# Patient Record
Sex: Female | Born: 1990 | Race: Black or African American | Hispanic: No | Marital: Single | Smoking: Never smoker
Health system: Southern US, Community
[De-identification: ages and names within clinical notes are randomized; demographics above are authoritative.]

## PROBLEM LIST (undated history)

## (undated) DIAGNOSIS — N390 Urinary tract infection, site not specified: Secondary | ICD-10-CM

## (undated) DIAGNOSIS — B009 Herpesviral infection, unspecified: Secondary | ICD-10-CM

## (undated) DIAGNOSIS — A749 Chlamydial infection, unspecified: Secondary | ICD-10-CM

## (undated) DIAGNOSIS — T7840XA Allergy, unspecified, initial encounter: Secondary | ICD-10-CM

## (undated) HISTORY — DX: Allergy, unspecified, initial encounter: T78.40XA

## (undated) HISTORY — DX: Herpesviral infection, unspecified: B00.9

## (undated) HISTORY — PX: NO PAST SURGERIES: SHX2092

---
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
## 2012-02-08 ENCOUNTER — Emergency Department (HOSPITAL_COMMUNITY)
Admission: EM | Admit: 2012-02-08 | Discharge: 2012-02-08 | Disposition: A | Discharge status: Home / Self Care | Payer: Self-pay | Source: Home / Self Care | Attending: Emergency Medicine | Admitting: Emergency Medicine

## 2012-02-08 ENCOUNTER — Encounter (HOSPITAL_COMMUNITY): Payer: Self-pay | Admitting: Emergency Medicine

## 2012-02-08 ENCOUNTER — Emergency Department (INDEPENDENT_AMBULATORY_CARE_PROVIDER_SITE_OTHER)
Admission: EM | Admit: 2012-02-08 | Discharge: 2012-02-08 | Disposition: A | Discharge status: Home Health | Payer: Self-pay | Source: Home / Self Care | Attending: Family Medicine | Admitting: Family Medicine

## 2012-02-08 DIAGNOSIS — A6000 Herpesviral infection of urogenital system, unspecified: Secondary | ICD-10-CM

## 2012-02-08 MED ORDER — VALACYCLOVIR HCL 1 G PO TABS: 1000.0000 mg | ORAL_TABLET | Freq: Two times a day (BID) | ORAL | Status: DC

## 2012-02-08 NOTE — Discharge Instructions (Signed)
Take all of medicine as prescribed , see gyn if further problems or outbreaks. We will call with test results.

## 2012-02-08 NOTE — ED Provider Notes (Signed)
History     CSN: [id number]  Arrival date & time 02/08/12  1150   First MD Initiated Contact with Patient 02/08/12 1506      Chief Complaint  Patient presents with  . Vaginal Pain  . Genital Warts    (Consider location/radiation/quality/duration/timing/severity/associated sxs/prior treatment) Patient is a 21 y.o. female presenting with vaginal discharge. The history is provided by the patient.  Vaginal Discharge This is a new problem. The current episode started more than 2 days ago. The problem occurs constantly. The problem has been gradually worsening. Associated symptoms comments: Inguinal nodes tender.. Exacerbated by: urination pain in vulvar area.    History reviewed. No pertinent past medical history.  History reviewed. No pertinent past surgical history.  No family history on file.  History  Substance Use Topics  . Smoking status: Never Smoker   . Smokeless tobacco: Not on file  . Alcohol Use: No    OB History    Grav Para Term Preterm Abortions TAB SAB Ect Mult Living                  Review of Systems  Constitutional: Negative.   Gastrointestinal: Negative.   Genitourinary: Positive for vaginal discharge and vaginal pain.    Allergies  Review of patient's allergies indicates no known allergies.  Home Medications   Current Outpatient Rx  Name Route Sig Dispense Refill  . VALACYCLOVIR HCL 1 G PO TABS Oral Take 1 tablet (1,000 mg total) by mouth 2 (two) times daily. 20 tablet 0    BP 119/83  Pulse 94  Temp(Src) 98.6 F (37 C) (Oral)  Resp 16  SpO2 100%  LMP 02/07/2012  Physical Exam  Nursing note and vitals reviewed. Constitutional: She appears well-developed and well-nourished.  Abdominal: Soft. Bowel sounds are normal.  Genitourinary:     Skin: Skin is warm and dry.    ED Course  Procedures (including critical care time)  Labs Reviewed - No data to display No results found.   1. Primary genital herpes simplex infection        MDM          Barkley Bruns, MD 02/08/12 [phone]

## 2012-02-08 NOTE — ED Notes (Signed)
PT HERE FOR POSS GENITAL WARTS THAT APPEARED 02/02/12 POST UNPROTECTED SEX WITH BOYFRIEND.C/O CONSTANT,BURNING WITH URINATION,RED BLISTERS SEEN.PT TRIED TO TREAT WITH AZO/MONISTAT FOR YEAST INFECTION BUT PT STATES BLISTERS WORSENED.VAG ODOR AND MIN D/C ALSO REPORTED.PT IS CURRENTLY ON MENSTRUAL

## 2012-02-12 LAB — HERPES SIMPLEX VIRUS CULTURE

## 2012-02-13 ENCOUNTER — Telehealth (HOSPITAL_COMMUNITY): Payer: Self-pay | Admitting: *Deleted

## 2012-02-13 NOTE — ED Notes (Signed)
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 Pt. adequately treated with Valtrex. I called pt. on cell # and it was busy. I called on home #.   Pt. verified x 2 and given results. Pt. instructed to notify her partner. You can pass the virus even when you don't have an outbreak, so always practice safe sex. Get treated for each outbreak with Acyclovir or Valtrex. You may want to get an OB-GYN doctor who can call in a prescription for you when you have an outbreak. Pt. voiced understanding. Pt. asked if she needed to notify any future partners and I said it would be the best thing to do because if the condom ever breaks and your partner gets it, he won't be happy the you did not tell him. Vassie Moselle 02/13/2012

## 2012-03-29 ENCOUNTER — Encounter: Payer: Self-pay | Admitting: Obstetrics and Gynecology

## 2012-04-09 ENCOUNTER — Encounter: Payer: Self-pay | Admitting: Obstetrics and Gynecology

## 2012-04-12 ENCOUNTER — Encounter: Payer: Self-pay | Admitting: Obstetrics and Gynecology

## 2012-04-16 ENCOUNTER — Ambulatory Visit (INDEPENDENT_AMBULATORY_CARE_PROVIDER_SITE_OTHER): Payer: Self-pay | Admitting: Obstetrics and Gynecology

## 2012-04-16 ENCOUNTER — Encounter: Payer: Self-pay | Admitting: Obstetrics and Gynecology

## 2012-04-16 VITALS — BP 116/68 | Temp 99.2°F | Ht 65.0 in | Wt 159.0 lb

## 2012-04-16 DIAGNOSIS — A64 Unspecified sexually transmitted disease: Secondary | ICD-10-CM

## 2012-04-16 DIAGNOSIS — Z113 Encounter for screening for infections with a predominantly sexual mode of transmission: Secondary | ICD-10-CM

## 2012-04-16 MED ORDER — VALACYCLOVIR HCL 1 G PO TABS: 500.0000 mg | ORAL_TABLET | Freq: Two times a day (BID) | ORAL | Status: DC

## 2012-04-16 NOTE — Progress Notes (Signed)
20 YO requesting STD testing.  Denies vaginitis symptoms but has questions about herpes.  Also requests refills on her Valtrex.  O:  Pelvic:  EGBUS-erythematous, mildly tender with small  lesions, vagina-white discharge, cervix-without lesions or tenderness, uterus-normal size & shape, adnexae-no masses or tenderness  A: STD Testing      HSV-2  P: Refilled Valtrex 500 mg #30 bid x 3-5 days prn          GC/CT/RPR -pending      Reviewed HSV-2 prevention, transmission, treatment,     natural history and considerations; cdc website info     given

## 2012-04-18 ENCOUNTER — Telehealth: Payer: Self-pay

## 2012-04-18 NOTE — Progress Notes (Signed)
TYVONA WILL YOU  DO THIS FOR ME. THANKS  LM

## 2012-04-19 NOTE — Telephone Encounter (Signed)
Informed pt + chlamydia results. Zithromax 500 mg #2 2 po stat called to [facility] [address]. Pt also informed we can write a rx for her partner. She stated she doesn't want her partner treated. Also made a f/u appointment 05/09/12 @ 4pm. Pt voices understanding & agrees.

## 2012-04-19 NOTE — Telephone Encounter (Signed)
Emily Horne took care of pt

## 2012-05-09 ENCOUNTER — Encounter: Payer: Self-pay | Admitting: Obstetrics and Gynecology

## 2013-03-13 ENCOUNTER — Inpatient Hospital Stay (HOSPITAL_COMMUNITY)
Admission: AD | Admit: 2013-03-13 | Discharge: 2013-03-13 | Disposition: A | Discharge status: Home / Self Care | Payer: Medicaid Other | Source: Ambulatory Visit | Attending: Obstetrics & Gynecology | Admitting: Obstetrics & Gynecology

## 2013-03-13 ENCOUNTER — Encounter (HOSPITAL_COMMUNITY): Payer: Self-pay

## 2013-03-13 DIAGNOSIS — O200 Threatened abortion: Secondary | ICD-10-CM | POA: Insufficient documentation

## 2013-03-13 DIAGNOSIS — Z8619 Personal history of other infectious and parasitic diseases: Secondary | ICD-10-CM | POA: Diagnosis not present

## 2013-03-13 DIAGNOSIS — B009 Herpesviral infection, unspecified: Secondary | ICD-10-CM | POA: Diagnosis not present

## 2013-03-13 LAB — CBC
HCT: 37.5 % (ref 36.0–46.0)
Hemoglobin: 12.4 g/dL (ref 12.0–15.0)
RDW: 13.7 % (ref 11.5–15.5)
WBC: 11.9 10*3/uL — ABNORMAL HIGH (ref 4.0–10.5)

## 2013-03-13 LAB — HCG, QUANTITATIVE, PREGNANCY: hCG, Beta Chain, Quant, S: 14 m[IU]/mL — ABNORMAL HIGH (ref <5)

## 2013-03-13 NOTE — MAU Provider Note (Signed)
Attestation of Attending Supervision of Advanced Practitioner (CNM/NP): Evaluation and management procedures were performed by the Advanced Practitioner under my supervision and collaboration.  I have reviewed the Advanced Practitioner's note and chart, and I agree with the management and plan.  HARRAWAY-SMITH, Cristela Stalder 7:42 PM

## 2013-03-13 NOTE — MAU Note (Signed)
Still bleeding at present, no abnormal vaginal discharge

## 2013-03-13 NOTE — MAU Note (Signed)
Patient states she had a positive home pregnancy test about 2 weeks ago. States that she started bleeding on 3-27 and passed something that was round with bloody strings hanging from it. States she continues to bleed like a period. No pain.

## 2013-03-13 NOTE — MAU Provider Note (Signed)
History     CSN: [id number]  Arrival date and time: 03/13/13 4098   None     Chief Complaint  Patient presents with  . Possible Pregnancy   HPI This is a 22 y.o. female at 6.[redacted]weeks gestation (by LMP) who presents with c/o vaginal bleeding with passage of what she think was a gestational sac on 3/27.  Bleeding less now. No cramps. Formerly a patient of [facility] but does not have insurance now.  Works Office manager at [employer].  Does not want to have a physical exam. "I just want to find out if I am still pregnant".    RN Note: Patient states she had a positive home pregnancy test about 2 weeks ago. States that she started bleeding on 3-27 and passed something that was round with bloody strings hanging from it. States she continues to bleed like a period. No pain.        OB History   Grav Para Term Preterm Abortions TAB SAB Ect Mult Living   0 0 0             Past Medical History  Diagnosis Date  . Herpes simplex without mention of complication     No past surgical history on file.  Family History  Problem Relation Age of Onset  . Diabetes Father   . Asthma Father   . Migraines Mother     History  Substance Use Topics  . Smoking status: Never Smoker   . Smokeless tobacco: Never Used  . Alcohol Use: Yes     Comment: socialy    Allergies: No Known Allergies  Prescriptions prior to admission  Medication Sig Dispense Refill  . valACYclovir (VALTREX) 1000 MG tablet Take 0.5 tablets (500 mg total) by mouth 2 (two) times daily.  30 tablet  11    Review of Systems  Constitutional: Negative for fever, chills and malaise/fatigue.  Gastrointestinal: Negative for nausea, vomiting, abdominal pain, diarrhea and constipation.  Genitourinary:       Light vaginal bleeding  Neurological: Negative for headaches.   Physical Exam   Blood pressure 122/94, pulse 100, temperature 98.3 F (36.8 C), temperature source Oral, resp. rate 16, height 5\' 5"  (1.651 m), weight 167 lb 6.4 oz (75.932  kg), last menstrual period 01/24/2013, SpO2 100.00%.  Physical Exam  Constitutional: She is oriented to person, place, and time. She appears well-developed and well-nourished. No distress.  HENT:  Head: Normocephalic.  Cardiovascular: Normal rate.   Respiratory: Effort normal.  GI:  Declines physical examination  Neurological: She is alert and oriented to person, place, and time.  Skin: Skin is warm and dry.  Psychiatric: She has a normal mood and affect.    MAU Course  Procedures  MDM Quant, CBC, and ABO/Rh drawn. Discussed possibility that she still could be pregnant, even with bleeding. Pt insists what she saw pass was a gestational sac, as it was white, globular.  States "you are freaking me out, saying I could still be pregnant".  Explained that even after miscarriage, sometimes pregnancy hormone persists and we need to establish that it is going down or already 0. Pt agreeable to that, but refuses physical examination.  Results for orders placed during the hospital encounter of 03/13/13 (from the past 24 hour(s))  HCG, QUANTITATIVE, PREGNANCY     Status: Abnormal   Collection Time    03/13/13  8:15 AM      Result Value Range   hCG, Beta Chain, Quant, S 14 (*) <5  mIU/mL  CBC     Status: Abnormal   Collection Time    03/13/13  8:15 AM      Result Value Range   WBC 11.9 (*) 4.0 - 10.5 K/uL   RBC 4.28  3.87 - 5.11 MIL/uL   Hemoglobin 12.4  12.0 - 15.0 g/dL   HCT 95.6  21.3 - 08.6 %   MCV 87.6  78.0 - 100.0 fL   MCH 29.0  26.0 - 34.0 pg   MCHC 33.1  30.0 - 36.0 g/dL   RDW 57.8  46.9 - 62.9 %   Platelets 386  150 - 400 K/uL  ABO/RH     Status: None   Collection Time    03/13/13  8:15 AM      Result Value Range   ABO/RH(D) O POS      Assessment and Plan  A:  Pregnancy at 6.6 weeks by LMP      Probable SAB  P:  Reviewed findings      Pt informed we cannot assume this is an SAB without rechecking Quant HCG      Pt declines Comfort kit. States she is happy about SAB,  was going to abort anyway.      Offered to schedule followup visit in clinic, but she wants to save money and go to [place] at [facility] 03/13/2013, 8:22 AM

## 2013-03-16 ENCOUNTER — Encounter (HOSPITAL_COMMUNITY): Payer: Self-pay | Admitting: Emergency Medicine

## 2013-03-16 ENCOUNTER — Emergency Department (INDEPENDENT_AMBULATORY_CARE_PROVIDER_SITE_OTHER)
Admission: EM | Admit: 2013-03-16 | Discharge: 2013-03-16 | Disposition: A | Discharge status: Home / Self Care | Payer: Medicaid Other | Source: Home / Self Care

## 2013-03-16 DIAGNOSIS — J020 Streptococcal pharyngitis: Secondary | ICD-10-CM

## 2013-03-16 LAB — POCT RAPID STREP A: Streptococcus, Group A Screen (Direct): POSITIVE — AB

## 2013-03-16 MED ORDER — AMOXICILLIN 500 MG PO CAPS: 1000.0000 mg | ORAL_CAPSULE | Freq: Two times a day (BID) | ORAL | Status: DC

## 2013-03-16 NOTE — ED Notes (Signed)
Sore throat that started yesterday.  100.9 temp this am.  Reports generalized body aches

## 2013-03-16 NOTE — ED Provider Notes (Signed)
History     CSN: [id number]  Arrival date & time 03/16/13  1100   First MD Initiated Contact with Patient 03/16/13 1117      Chief Complaint  Patient presents with  . Sore Throat    (Consider location/radiation/quality/duration/timing/severity/associated sxs/prior treatment) HPI Comments: 22 year old female accompanied by significant other complaining of sore throat for less than 24 hours. Is associated with a painful swallowing, fever and general bodyaches. She states she vomited once. No diarrhea, abdominal pain, earache, upper respiratory congestion abdominal pain.   Past Medical History  Diagnosis Date  . Herpes simplex without mention of complication     Past Surgical History  Procedure Laterality Date  . No past surgeries      Family History  Problem Relation Age of Onset  . Diabetes Father   . Asthma Father   . Migraines Mother     History  Substance Use Topics  . Smoking status: Never Smoker   . Smokeless tobacco: Never Used  . Alcohol Use: Yes     Comment: socialy    OB History   Grav Para Term Preterm Abortions TAB SAB Ect Mult Living   0 0 0             Review of Systems  Constitutional: Positive for fever and activity change. Negative for chills, appetite change and fatigue.  HENT: Positive for sore throat and trouble swallowing. Negative for congestion, facial swelling, rhinorrhea, neck pain, neck stiffness and postnasal drip.   Eyes: Negative.   Respiratory: Negative.   Cardiovascular: Negative.   Gastrointestinal: Negative.   Genitourinary: Negative.   Musculoskeletal: Negative.   Skin: Negative.  Negative for pallor and rash.  Neurological: Negative.     Allergies  Review of patient's allergies indicates no known allergies.  Home Medications   Current Outpatient Rx  Name  Route  Sig  Dispense  Refill  . ValACYclovir HCl (VALTREX PO)   Oral   Take by mouth.         Marland Kitchen amoxicillin (AMOXIL) 500 MG capsule   Oral   Take 2 capsules  (1,000 mg total) by mouth 2 (two) times daily.   28 capsule   0   . HYDROcodone-acetaminophen (VICODIN) 5-500 MG per tablet   Oral   Take 1 tablet by mouth daily as needed for pain.         Marland Kitchen OVER THE COUNTER MEDICATION   Oral   Take 2 tablets by mouth daily as needed (pt takes every morning as needed for sleep.).           BP 111/74  Pulse 111  Temp(Src) 99.4 F (37.4 C) (Oral)  Resp 16  SpO2 98%  LMP 03/09/2013  Physical Exam  Nursing note and vitals reviewed. Constitutional: She is oriented to person, place, and time. She appears well-developed and well-nourished. No distress.  HENT:  Bilateral TMs are normal Oropharynx with deep erythema, mildly enlarged palatine tonsils coated with white exudates.  Neck: Normal range of motion. Neck supple.  No posterior cervical nodes palpated  Cardiovascular: Normal rate and regular rhythm.   Pulmonary/Chest: Effort normal and breath sounds normal. No respiratory distress.  Musculoskeletal: Normal range of motion. She exhibits no edema.  Lymphadenopathy:    She has cervical adenopathy.  Neurological: She is alert and oriented to person, place, and time.  Skin: Skin is warm and dry. No rash noted.  Psychiatric: She has a normal mood and affect.    ED Course  Procedures (  including critical care time)  Labs Reviewed - No data to display No results found.      1. Streptococcal pharyngitis       MDM  Strep swab is positive. Amoxicillin 500 mg 2 capsules twice a day for 7 days Drink plenty of fluids and stay well-hydrated Cepacol lozenges for sore throat discomfort Chloraseptic Spray. Tylenol 650 mg every 4-6 hours when necessary sore throat pain. Recheck with your primary care doctor if not improving in 48 hours. If worsening symptoms or problems may return.        Hayden Rasmussen, NP 03/16/13 1144

## 2013-03-16 NOTE — ED Provider Notes (Signed)
Medical screening examination/treatment/procedure(s) were performed by non-physician practitioner and as supervising physician I was immediately available for consultation/collaboration.  Leslee Home, M.D.  Reuben Likes, MD 03/16/13 1536

## 2013-05-16 ENCOUNTER — Encounter: Payer: Self-pay | Admitting: Internal Medicine

## 2013-05-16 ENCOUNTER — Ambulatory Visit (INDEPENDENT_AMBULATORY_CARE_PROVIDER_SITE_OTHER): Payer: BC Managed Care – PPO | Admitting: Internal Medicine

## 2013-05-16 VITALS — BP 122/98 | Temp 98.0°F | Ht 66.0 in | Wt 161.4 lb

## 2013-05-16 DIAGNOSIS — J309 Allergic rhinitis, unspecified: Secondary | ICD-10-CM

## 2013-05-16 DIAGNOSIS — Z0000 Encounter for general adult medical examination without abnormal findings: Secondary | ICD-10-CM

## 2013-05-16 DIAGNOSIS — B009 Herpesviral infection, unspecified: Secondary | ICD-10-CM

## 2013-05-16 MED ORDER — VITAMIN D 1000 UNITS PO TABS: 1000.0000 [IU] | ORAL_TABLET | Freq: Every day | ORAL | Status: DC

## 2013-05-16 MED ORDER — FLUTICASONE PROPIONATE 50 MCG/ACT NA SUSP: 2.0000 | Freq: Every day | NASAL | Status: DC

## 2013-05-16 MED ORDER — VALACYCLOVIR HCL 500 MG PO TABS: 500.0000 mg | ORAL_TABLET | Freq: Two times a day (BID) | ORAL | Status: DC

## 2013-05-16 MED ORDER — LORATADINE 10 MG PO TABS: 10.0000 mg | ORAL_TABLET | Freq: Every day | ORAL | Status: DC

## 2013-05-16 NOTE — Progress Notes (Signed)
  Subjective:    Patient ID: Emily Horne, female    DOB: 1991/11/25, 22 y.o.   MRN: [id number]  HPI The patient is here for a wellness exam. The patient has been doing well overall without major physical or psychological issues going on lately.   Review of Systems  Constitutional: Positive for unexpected weight change. Negative for fever, chills, diaphoresis, activity change, appetite change and fatigue.  HENT: Negative for hearing loss, ear pain, congestion, sore throat, sneezing, mouth sores, neck pain, dental problem, voice change, postnasal drip and sinus pressure.   Eyes: Negative for pain and visual disturbance.  Respiratory: Negative for cough, chest tightness, wheezing and stridor.   Cardiovascular: Negative for chest pain, palpitations and leg swelling.  Gastrointestinal: Negative for nausea, vomiting, abdominal pain, blood in stool, abdominal distention and rectal pain.  Genitourinary: Negative for dysuria, hematuria, decreased urine volume, vaginal bleeding, vaginal discharge, difficulty urinating, vaginal pain and menstrual problem.  Musculoskeletal: Negative for back pain, joint swelling and gait problem.  Skin: Negative for color change, rash and wound.  Neurological: Negative for dizziness, tremors, syncope, speech difficulty and light-headedness.  Hematological: Negative for adenopathy.  Psychiatric/Behavioral: Negative for suicidal ideas, hallucinations, behavioral problems, confusion, sleep disturbance, dysphoric mood and decreased concentration. The patient is not hyperactive.        Objective:   Physical Exam  Constitutional: She appears well-developed. No distress.  Obese   HENT:  Head: Normocephalic.  Right Ear: External ear normal.  Left Ear: External ear normal.  Nose: Nose normal.  Mouth/Throat: Oropharynx is clear and moist.  Eyes: Conjunctivae are normal. Pupils are equal, round, and reactive to light. Right eye exhibits no discharge. Left eye exhibits  no discharge.  Neck: Normal range of motion. Neck supple. No JVD present. No tracheal deviation present. No thyromegaly present.  Cardiovascular: Normal rate, regular rhythm and normal heart sounds.   Pulmonary/Chest: No stridor. No respiratory distress. She has no wheezes.  Abdominal: Soft. Bowel sounds are normal. She exhibits no distension and no mass. There is no tenderness. There is no rebound and no guarding.  Musculoskeletal: She exhibits no edema and no tenderness.  Lymphadenopathy:    She has no cervical adenopathy.  Neurological: She displays normal reflexes. No cranial nerve deficit. She exhibits normal muscle tone. Coordination normal.  Skin: No rash noted. No erythema.  Psychiatric: She has a normal mood and affect. Her behavior is normal. Judgment and thought content normal.          Assessment & Plan:

## 2013-05-16 NOTE — Assessment & Plan Note (Signed)
Loratidine. Flonase 

## 2013-05-20 ENCOUNTER — Encounter: Payer: Self-pay | Admitting: Internal Medicine

## 2013-05-20 NOTE — Assessment & Plan Note (Signed)
Safe sex Rx prn

## 2013-05-20 NOTE — Assessment & Plan Note (Signed)
We discussed age appropriate health related issues, including available/recomended screening tests and vaccinations. We discussed a need for adhering to healthy diet and exercise. Labs were reviewed/ordered. All questions were answered. Age and sex related issues discussed (safe sex, seat belt use, etc.). Gardasil suggested, info given. PAP w/GYN

## 2013-07-01 ENCOUNTER — Emergency Department (INDEPENDENT_AMBULATORY_CARE_PROVIDER_SITE_OTHER)
Admission: EM | Admit: 2013-07-01 | Discharge: 2013-07-01 | Disposition: A | Discharge status: Home / Self Care | Payer: BC Managed Care – PPO | Source: Home / Self Care | Attending: Emergency Medicine | Admitting: Emergency Medicine

## 2013-07-01 ENCOUNTER — Encounter (HOSPITAL_COMMUNITY): Payer: Self-pay | Admitting: Emergency Medicine

## 2013-07-01 DIAGNOSIS — J36 Peritonsillar abscess: Secondary | ICD-10-CM

## 2013-07-01 DIAGNOSIS — J029 Acute pharyngitis, unspecified: Secondary | ICD-10-CM

## 2013-07-01 LAB — POCT RAPID STREP A: Streptococcus, Group A Screen (Direct): NEGATIVE

## 2013-07-01 MED ORDER — LIDOCAINE HCL (PF) 1 % IJ SOLN
INTRAMUSCULAR | Status: AC
  Filled 2013-07-01: qty 5

## 2013-07-01 MED ORDER — CEFTRIAXONE SODIUM 1 G IJ SOLR
INTRAMUSCULAR | Status: AC
  Filled 2013-07-01: qty 10

## 2013-07-01 MED ORDER — CLINDAMYCIN HCL 300 MG PO CAPS: 300.0000 mg | ORAL_CAPSULE | Freq: Four times a day (QID) | ORAL | Status: DC

## 2013-07-01 MED ORDER — DEXAMETHASONE SODIUM PHOSPHATE 10 MG/ML IJ SOLN
INTRAMUSCULAR | Status: AC
  Filled 2013-07-01: qty 1

## 2013-07-01 MED ORDER — DEXAMETHASONE SODIUM PHOSPHATE 10 MG/ML IJ SOLN
10.0000 mg | Freq: Once | INTRAMUSCULAR | Status: AC
  Administered 2013-07-01: 10 mg via INTRAMUSCULAR

## 2013-07-01 MED ORDER — CEFTRIAXONE SODIUM 1 G IJ SOLR
1.0000 g | Freq: Once | INTRAMUSCULAR | Status: AC
  Administered 2013-07-01: 1 g via INTRAMUSCULAR

## 2013-07-01 MED ORDER — AMOXICILLIN-POT CLAVULANATE 875-125 MG PO TABS: 1.0000 | ORAL_TABLET | Freq: Two times a day (BID) | ORAL | Status: DC

## 2013-07-01 NOTE — ED Notes (Signed)
Pt c/o sore throat onset Thursday... sxs include: swollen glands, odynophagia... Has been taking left over antibiotics from previous strep about 4 months ago... She is alert w/no signs of acute distress.

## 2013-07-01 NOTE — ED Provider Notes (Signed)
History    CSN: [id number] Arrival date & time 07/01/13  1644  None    Chief Complaint  Patient presents with  . Sore Throat   (Consider location/radiation/quality/duration/timing/severity/associated sxs/prior Treatment) HPI Comments: 22 year old female presents complaining of severe sore throat past 4 days. It initially began as are mild and resolved with taking over-the-counter ibuprofen. She did not have any more discomfort for the next 3 days, but yesterday the pain got very bad. She has increasing soreness, primarily on her right side of her throat. She has also had some subjective fever and chills. She denies rash, chest pain, cough, shortness of breath.  Patient is a 22 y.o. female presenting with pharyngitis.  Sore Throat Pertinent negatives include no chest pain, no abdominal pain and no shortness of breath.   Past Medical History  Diagnosis Date  . Herpes simplex without mention of complication   . Allergy    Past Surgical History  Procedure Laterality Date  . No past surgeries     Family History  Problem Relation Age of Onset  . Diabetes Father   . Asthma Father   . Migraines Mother    History  Substance Use Topics  . Smoking status: Never Smoker   . Smokeless tobacco: Never Used  . Alcohol Use: Yes     Comment: socialy   OB History   Grav Para Term Preterm Abortions TAB SAB Ect Mult Living   0 0 0            Review of Systems  Constitutional: Positive for fever and chills.  HENT: Positive for sore throat. Negative for ear pain, facial swelling, trouble swallowing, neck pain, voice change, postnasal drip and sinus pressure.   Eyes: Negative for visual disturbance.  Respiratory: Negative for cough and shortness of breath.   Cardiovascular: Negative for chest pain, palpitations and leg swelling.  Gastrointestinal: Negative for nausea, vomiting and abdominal pain.  Endocrine: Negative for polydipsia and polyuria.  Genitourinary: Negative for dysuria,  urgency and frequency.  Musculoskeletal: Negative for myalgias and arthralgias.  Skin: Negative for rash.  Neurological: Negative for dizziness, weakness and light-headedness.    Allergies  Review of patient's allergies indicates no known allergies.  Home Medications   Current Outpatient Rx  Name  Route  Sig  Dispense  Refill  . amoxicillin-clavulanate (AUGMENTIN) 875-125 MG per tablet   Oral   Take 1 tablet by mouth every 12 (twelve) hours.   20 tablet   0   . cholecalciferol (VITAMIN D) 1000 UNITS tablet   Oral   Take 1 tablet (1,000 Units total) by mouth daily.   100 tablet   3   . fluticasone (FLONASE) 50 MCG/ACT nasal spray   Nasal   Place 2 sprays into the nose daily.   16 g   6   . loratadine (CLARITIN) 10 MG tablet   Oral   Take 1 tablet (10 mg total) by mouth daily.   100 tablet   3   . valACYclovir (VALTREX) 500 MG tablet   Oral   Take 1 tablet (500 mg total) by mouth 2 (two) times daily.   14 tablet   3    BP 117/84  Pulse 102  Temp(Src) 99.1 F (37.3 C) (Oral)  Resp 16  SpO2 100%  LMP 07/01/2013 Physical Exam  Nursing note and vitals reviewed. Constitutional: She is oriented to person, place, and time. Vital signs are normal. She appears well-developed and well-nourished. No distress.  HENT:  Head: Atraumatic.  Mouth/Throat: Oropharyngeal exudate, posterior oropharyngeal edema and posterior oropharyngeal erythema present. Tonsillar abscesses: Possible.  There is posterior oropharynx swelling, erythema, exudates, with slight deviation of the uvula to the left  Eyes: EOM are normal. Pupils are equal, round, and reactive to light.  Cardiovascular: Normal rate, regular rhythm and normal heart sounds.  Exam reveals no gallop and no friction rub.   No murmur heard. Pulmonary/Chest: Effort normal and breath sounds normal. No respiratory distress. She has no wheezes. She has no rales.  Abdominal: Soft. There is no tenderness.  Lymphadenopathy:        Head (right side): Tonsillar (much larger and more tender on the right) adenopathy present.       Head (left side): Tonsillar adenopathy present.  Neurological: She is alert and oriented to person, place, and time. She has normal strength.  Skin: Skin is warm and dry. She is not diaphoretic.  Psychiatric: She has a normal mood and affect. Her behavior is normal. Judgment normal.    ED Course  Procedures (including critical care time) Labs Reviewed  POCT RAPID STREP A ([facility] ONLY)   No results found. 1. Pharyngitis   2. Peritonsillar cellulitis    Given intramuscular dexamethasone and ceftriaxone today in the office MDM  This patient has exudative pharyngitis with unilateral swelling of the posterior oropharynx on the right side. She is afebrile and is handling secretions. In this case, this is probably indicative of a peritonsillar cellulitis and not abscess. Discussed with the otolaryngologist on call.he would like her to be on oral Augmentin and followup with him tomorrow at 11 AM. She understands and will keep this appointment.    Meds ordered this encounter  Medications  . cefTRIAXone (ROCEPHIN) injection 1 g    Sig:   . dexamethasone (DECADRON) injection 10 mg    Sig:   . DISCONTD: clindamycin (CLEOCIN) 300 MG capsule    Sig: Take 1 capsule (300 mg total) by mouth 4 (four) times daily.    Dispense:  40 capsule    Refill:  0  . amoxicillin-clavulanate (AUGMENTIN) 875-125 MG per tablet    Sig: Take 1 tablet by mouth every 12 (twelve) hours.    Dispense:  20 tablet    Refill:  0     Graylon Good, PA-C 07/01/13 2052

## 2013-07-02 NOTE — ED Provider Notes (Signed)
Medical screening examination/treatment/procedure(s) were performed by resident physician or non-physician practitioner and as supervising physician I was immediately available for consultation/collaboration.   KINDL,JAMES DOUGLAS MD.   James D Kindl, MD 07/02/13 2030 

## 2013-07-03 LAB — CULTURE, GROUP A STREP

## 2013-11-26 ENCOUNTER — Inpatient Hospital Stay (HOSPITAL_COMMUNITY)
Admission: AD | Admit: 2013-11-26 | Discharge: 2013-11-26 | Disposition: A | Discharge status: Home / Self Care | Payer: BC Managed Care – PPO | Source: Ambulatory Visit | Attending: Obstetrics and Gynecology | Admitting: Obstetrics and Gynecology

## 2013-11-26 ENCOUNTER — Telehealth (HOSPITAL_COMMUNITY): Payer: Self-pay | Admitting: Obstetrics and Gynecology

## 2013-11-26 ENCOUNTER — Encounter (HOSPITAL_COMMUNITY): Payer: Self-pay | Admitting: *Deleted

## 2013-11-26 DIAGNOSIS — S3140XA Unspecified open wound of vagina and vulva, initial encounter: Secondary | ICD-10-CM | POA: Insufficient documentation

## 2013-11-26 DIAGNOSIS — Y92009 Unspecified place in unspecified non-institutional (private) residence as the place of occurrence of the external cause: Secondary | ICD-10-CM | POA: Insufficient documentation

## 2013-11-26 DIAGNOSIS — W2203XA Walked into furniture, initial encounter: Secondary | ICD-10-CM | POA: Insufficient documentation

## 2013-11-26 DIAGNOSIS — S01512A Laceration without foreign body of oral cavity, initial encounter: Secondary | ICD-10-CM

## 2013-11-26 DIAGNOSIS — N898 Other specified noninflammatory disorders of vagina: Secondary | ICD-10-CM | POA: Insufficient documentation

## 2013-11-26 MED ORDER — LIDOCAINE HCL 2 % EX GEL
Freq: Once | CUTANEOUS | Status: AC
  Administered 2013-11-26: 5 via TOPICAL
  Filled 2013-11-26: qty 5

## 2013-11-26 MED ORDER — BENZOCAINE-MENTHOL 20-0.5 % EX AERO
1.0000 "application " | INHALATION_SPRAY | Freq: Four times a day (QID) | CUTANEOUS | Status: DC | PRN
  Filled 2013-11-26: qty 56

## 2013-11-26 MED ORDER — OXYCODONE-ACETAMINOPHEN 5-325 MG PO TABS
1.0000 | ORAL_TABLET | Freq: Once | ORAL | Status: AC
  Administered 2013-11-26: 1 via ORAL
  Filled 2013-11-26: qty 1

## 2013-11-26 NOTE — MAU Provider Note (Signed)
History   22 yo G1P0010 presented after calling to report striking vagina as she was jumping out of bed to go to [facility], and began to have bleeding.  Denies any other trauma.   Patient of Emily Horne, [place].  Last seen at [facility] 04/2012.  Chief Complaint  Patient presents with  . Vaginal Bleeding   HPI:  See above  OB History   Grav Para Term Preterm Abortions TAB SAB Ect Mult Living   1 0 0  1  1   0      Past Medical History  Diagnosis Date  . Herpes simplex without mention of complication   . Allergy     Past Surgical History  Procedure Laterality Date  . No past surgeries      Family History  Problem Relation Age of Onset  . Diabetes Father   . Asthma Father   . Migraines Mother     History  Substance Use Topics  . Smoking status: Never Smoker   . Smokeless tobacco: Never Used  . Alcohol Use: Yes     Comment: socialy    Allergies: No Known Allergies  Prescriptions prior to admission  Medication Sig Dispense Refill  . ibuprofen (ADVIL,MOTRIN) 200 MG tablet Take 400-600 mg by mouth every 6 (six) hours as needed for headache.        ROS:  Perineal soreness, bleeding from laceration Physical Exam   Blood pressure 108/71, pulse 84, temperature 98 F (36.7 C), resp. rate 18, last menstrual period 10/31/2013.  Physical Exam  Genitourinary:      Chest clear Heart RRR without murmur Abd soft, NT Pelvic--2 cm long x 1/2 cm wide shallow laceration at junction of labia minora and majora, representing 1st degree laceration.  Small amount oozing of blood present and tender to touch.  No evidence of hematoma formation.  No other evidence of trauma noted. Ext WNL  ED Course  Vulvar trauma  Plan: Reviewed options for treatment--area would likely heal without repair, but advised patient it would be very sensitive during the healing, due to the rawness of the tissue.  I recommended repair, with local infiltration with lidocaine for comfort.   Patient very anxious regarding procedure. Applied small amount lidocaine 2% gel to area. Percocet 5/325 po x 1 prior to procedure.  Procedure: Area of and around laceration cleansed with Betadine. Infiltration with total of 5 cc 1% Lidocaine. Repaired with 3-0 vicry rapide for hemostasis. Patient tolerated the procedure well. Ice pack applied to area after cleaning.  Plan: D/C home with care instructions for vaginal/vulvar laceration. Recommend ice for 24 hours, or at least overnight, then use of sitz bath BID/TID for comfort.   Dermoplast spray to patient for use. May use Tylenol or Ibuprophen as needed for pain. 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 To f/u with any increase in bleeding, pain, swelling, or fever.  Nigel Bridgeman CNM, [place] 11/26/2013 10:14 PM

## 2013-11-26 NOTE — Telephone Encounter (Signed)
TC from patient: Not pregnant, patient of Henreitta Leber. Was jumping out of bed, hit vulva on bedpost. Now with bleeding and pain in vulva. Will see in MAU.

## 2013-11-26 NOTE — MAU Note (Signed)
Tried to jump of the foot of the bed to go to use the bathroom.  Hit the foot of the bed with vagina.  Started bleeding.

## 2014-03-08 ENCOUNTER — Emergency Department (HOSPITAL_COMMUNITY)
Admission: EM | Admit: 2014-03-08 | Discharge: 2014-03-08 | Disposition: A | Discharge status: Home / Self Care | Payer: Medicaid Other | Attending: Emergency Medicine | Admitting: Emergency Medicine

## 2014-03-08 ENCOUNTER — Encounter (HOSPITAL_COMMUNITY): Payer: Self-pay | Admitting: Emergency Medicine

## 2014-03-08 DIAGNOSIS — N390 Urinary tract infection, site not specified: Secondary | ICD-10-CM

## 2014-03-08 DIAGNOSIS — Z8619 Personal history of other infectious and parasitic diseases: Secondary | ICD-10-CM | POA: Insufficient documentation

## 2014-03-08 DIAGNOSIS — Z3202 Encounter for pregnancy test, result negative: Secondary | ICD-10-CM | POA: Insufficient documentation

## 2014-03-08 LAB — URINALYSIS, ROUTINE W REFLEX MICROSCOPIC
BILIRUBIN URINE: NEGATIVE
GLUCOSE, UA: NEGATIVE mg/dL
KETONES UR: NEGATIVE mg/dL
Nitrite: NEGATIVE
PROTEIN: NEGATIVE mg/dL
Specific Gravity, Urine: 1.025 (ref 1.005–1.030)
Urobilinogen, UA: 0.2 mg/dL (ref 0.0–1.0)
pH: 6 (ref 5.0–8.0)

## 2014-03-08 LAB — URINE MICROSCOPIC-ADD ON

## 2014-03-08 LAB — WET PREP, GENITAL
Trich, Wet Prep: NONE SEEN
YEAST WET PREP: NONE SEEN

## 2014-03-08 LAB — PREGNANCY, URINE: Preg Test, Ur: NEGATIVE

## 2014-03-08 MED ORDER — LIDOCAINE HCL (PF) 1 % IJ SOLN
INTRAMUSCULAR | Status: AC
  Administered 2014-03-08: 0.9 mL
  Filled 2014-03-08: qty 5

## 2014-03-08 MED ORDER — AZITHROMYCIN 250 MG PO TABS
1000.0000 mg | ORAL_TABLET | Freq: Once | ORAL | Status: AC
  Administered 2014-03-08: 1000 mg via ORAL
  Filled 2014-03-08: qty 4

## 2014-03-08 MED ORDER — SULFAMETHOXAZOLE-TRIMETHOPRIM 800-160 MG PO TABS: 1.0000 | ORAL_TABLET | Freq: Two times a day (BID) | ORAL | Status: AC

## 2014-03-08 MED ORDER — CEFTRIAXONE SODIUM 250 MG IJ SOLR
250.0000 mg | Freq: Once | INTRAMUSCULAR | Status: AC
  Administered 2014-03-08: 250 mg via INTRAMUSCULAR
  Filled 2014-03-08: qty 250

## 2014-03-08 NOTE — ED Provider Notes (Signed)
Medical screening examination/treatment/procedure(s) were performed by non-physician practitioner and as supervising physician I was immediately available for consultation/collaboration.     Vinny Taranto, MD 03/08/14 2331 

## 2014-03-08 NOTE — ED Provider Notes (Signed)
CSN: [id number]     Arrival date & time 03/08/14  1306 History  This chart was scribed for non-physician practitioner, Fayrene Helper, PA-C working with Doug Sou, MD by Greggory Stallion, ED scribe. This patient was seen in room TR08C/TR08C and the patient's care was started at 3:07 PM.   Chief Complaint  Patient presents with  . Dysuria   The history is provided by the patient. No language interpreter was used.   HPI Comments: Emily Horne is a 23 y.o. female who presents to the Emergency Department complaining of urinary frequency and odor that started 2-3 days ago. She states her urine is very cloudy and has had mild lower abdominal pain. Pt states this feels similar to her past UTIs. She had recurrent UTIs 2-3 years ago where she would have more than seven UTIs per year. Pt has had 2 sexual partners in the last 6 months and has not had protected sex every time. She has history of herpes and chlamydia. Denies fever, chills, nausea, emesis, back pain, hematuria, vaginal discharge. Denies history of diabetes. LMP was one week ago. Pt is requesting STD screening.   Past Medical History  Diagnosis Date  . Herpes simplex without mention of complication   . Allergy    Past Surgical History  Procedure Laterality Date  . No past surgeries     Family History  Problem Relation Age of Onset  . Diabetes Father   . Asthma Father   . Migraines Mother    History  Substance Use Topics  . Smoking status: Never Smoker   . Smokeless tobacco: Never Used  . Alcohol Use: Yes     Comment: socialy   OB History   Grav Para Term Preterm Abortions TAB SAB Ect Mult Living   1 0 0  1  1   0     Review of Systems  Constitutional: Negative for fever and chills.  Gastrointestinal: Positive for abdominal pain. Negative for nausea and vomiting.  Genitourinary: Positive for dysuria and frequency. Negative for hematuria and vaginal discharge.  Musculoskeletal: Negative for back pain.  All other systems  reviewed and are negative.   Allergies  Review of patient's allergies indicates no known allergies.  Home Medications   Current Outpatient Rx  Name  Route  Sig  Dispense  Refill  . ibuprofen (ADVIL,MOTRIN) 200 MG tablet   Oral   Take 400-600 mg by mouth every 6 (six) hours as needed for headache.          BP 111/70  Pulse 80  Temp(Src) 98.8 F (37.1 C) (Oral)  Resp 20  Wt 175 lb (79.379 kg)  SpO2 100%  Physical Exam  Nursing note and vitals reviewed. Constitutional: She is oriented to person, place, and time. She appears well-developed and well-nourished. No distress.  HENT:  Head: Normocephalic and atraumatic.  Eyes: EOM are normal.  Neck: Neck supple. No tracheal deviation present.  Cardiovascular: Normal rate, regular rhythm and normal heart sounds.  Exam reveals no gallop and no friction rub.   No murmur heard. Pulmonary/Chest: Effort normal and breath sounds normal. No respiratory distress. She has no wheezes. She has no rhonchi. She has no rales.  Abdominal: Soft. Bowel sounds are normal. She exhibits no distension. There is tenderness (mild suprapubic tenderness, no guarding, no rebound tenderness). There is no rebound, no guarding and no CVA tenderness.  Genitourinary:  Chaperone present:  Normal external genitalis with no inguinal lymphadenopathy. Normal labia majora, labia minora, vaginal vault  with mild functioning discharge, is malodorous, tenderness with speculum insertion. No adnexal tenderness, no cervical motion tenderness, cervical os is closed.  No CVA tenderness  Musculoskeletal: Normal range of motion.  Neurological: She is alert and oriented to person, place, and time.  Skin: Skin is warm and dry.  Psychiatric: She has a normal mood and affect. Her behavior is normal.    ED Course  Procedures (including critical care time)  DIAGNOSTIC STUDIES: Oxygen Saturation is 100% on RA, normal by my interpretation.    COORDINATION OF CARE: 3:11  PM-Discussed treatment plan which includes an antibiotic and pelvic exam with pt at bedside and pt agreed to plan.   3:45 PM Patient request for evaluation for STD however she declined HIV and herpes testing. She has no cervical motion tenderness on exam. Low suspicion for PID or cervicitis. She does have urinary tract infection which will be treated with antibiotic. We'll provide Rocephin and Zithromax medication prophylactically per patient request.  Labs Review Labs Reviewed  URINALYSIS, ROUTINE W REFLEX MICROSCOPIC - Abnormal; Notable for the following:    APPearance CLOUDY (*)    Hgb urine dipstick LARGE (*)    Leukocytes, UA MODERATE (*)    All other components within normal limits  URINE MICROSCOPIC-ADD ON - Abnormal; Notable for the following:    Squamous Epithelial / LPF FEW (*)    All other components within normal limits  GC/CHLAMYDIA PROBE AMP  WET PREP, GENITAL  PREGNANCY, URINE   Imaging Review No results found.   EKG Interpretation None      MDM   Final diagnoses:  UTI (lower urinary tract infection)    BP 111/70  Pulse 80  Temp(Src) 98.8 F (37.1 C) (Oral)  Resp 20  Wt 175 lb (79.379 kg)  SpO2 100%  LMP 03/02/2014   I personally performed the services described in this documentation, which was scribed in my presence. The recorded information has been reviewed and is accurate.    Fayrene HelperBowie Novella Abraha, PA-C 03/08/14 [phone]

## 2014-03-08 NOTE — ED Notes (Signed)
Pt woke up Saturday morning and when urinated small amounts and is frequent.  No dysuria, back pain or abd pain.  Pt wasn't to be tested for STD's.  No other s/s noted.

## 2014-03-08 NOTE — ED Notes (Signed)
Pt in c/o frequent urination, urinary odor, states she thinks she has a UTI, denies vaginal discharge or abdominal pain

## 2014-03-08 NOTE — Discharge Instructions (Signed)
Urinary Tract Infection  Urinary tract infections (UTIs) can develop anywhere along your urinary tract. Your urinary tract is your body's drainage system for removing wastes and extra water. Your urinary tract includes two kidneys, two ureters, a bladder, and a urethra. Your kidneys are a pair of bean-shaped organs. Each kidney is about the size of your fist. They are located below your ribs, one on each side of your spine.  CAUSES  Infections are caused by microbes, which are microscopic organisms, including fungi, viruses, and bacteria. These organisms are so small that they can only be seen through a microscope. Bacteria are the microbes that most commonly cause UTIs.  SYMPTOMS   Symptoms of UTIs may vary by age and gender of the patient and by the location of the infection. Symptoms in young women typically include a frequent and intense urge to urinate and a painful, burning feeling in the bladder or urethra during urination. Older women and men are more likely to be tired, shaky, and weak and have muscle aches and abdominal pain. A fever may mean the infection is in your kidneys. Other symptoms of a kidney infection include pain in your back or sides below the ribs, nausea, and vomiting.  DIAGNOSIS  To diagnose a UTI, your caregiver will ask you about your symptoms. Your caregiver also will ask to provide a urine sample. The urine sample will be tested for bacteria and white blood cells. White blood cells are made by your body to help fight infection.  TREATMENT   Typically, UTIs can be treated with medication. Because most UTIs are caused by a bacterial infection, they usually can be treated with the use of antibiotics. The choice of antibiotic and length of treatment depend on your symptoms and the type of bacteria causing your infection.  HOME CARE INSTRUCTIONS   If you were prescribed antibiotics, take them exactly as your caregiver instructs you. Finish the medication even if you feel better after you  have only taken some of the medication.   Drink enough water and fluids to keep your urine clear or pale yellow.   Avoid caffeine, tea, and carbonated beverages. They tend to irritate your bladder.   Empty your bladder often. Avoid holding urine for long periods of time.   Empty your bladder before and after sexual intercourse.   After a bowel movement, women should cleanse from front to back. Use each tissue only once.  SEEK MEDICAL CARE IF:    You have back pain.   You develop a fever.   Your symptoms do not begin to resolve within 3 days.  SEEK IMMEDIATE MEDICAL CARE IF:    You have severe back pain or lower abdominal pain.   You develop chills.   You have nausea or vomiting.   You have continued burning or discomfort with urination.  MAKE SURE YOU:    Understand these instructions.   Will watch your condition.   Will get help right away if you are not doing well or get worse.  Document Released: 09/06/2005 Document Revised: 05/28/2012 Document Reviewed: 01/05/2012  [employer] Patient Information 2014 [employer]

## 2014-03-09 LAB — GC/CHLAMYDIA PROBE AMP
CT PROBE, AMP APTIMA: POSITIVE — AB
GC PROBE AMP APTIMA: NEGATIVE

## 2014-03-12 NOTE — ED Notes (Signed)
+   Chlamydia Patient treated with Rocephin And Zithromax-[facility] faxed 

## 2014-03-13 ENCOUNTER — Telehealth (HOSPITAL_BASED_OUTPATIENT_CLINIC_OR_DEPARTMENT_OTHER): Payer: Self-pay | Admitting: *Deleted

## 2014-03-15 ENCOUNTER — Emergency Department (HOSPITAL_COMMUNITY)
Admission: EM | Admit: 2014-03-15 | Discharge: 2014-03-15 | Disposition: A | Discharge status: Home / Self Care | Payer: Medicaid Other | Attending: Emergency Medicine | Admitting: Emergency Medicine

## 2014-03-15 ENCOUNTER — Encounter (HOSPITAL_COMMUNITY): Payer: Self-pay | Admitting: Emergency Medicine

## 2014-03-15 DIAGNOSIS — Z792 Long term (current) use of antibiotics: Secondary | ICD-10-CM | POA: Diagnosis not present

## 2014-03-15 DIAGNOSIS — J029 Acute pharyngitis, unspecified: Secondary | ICD-10-CM | POA: Diagnosis present

## 2014-03-15 DIAGNOSIS — J020 Streptococcal pharyngitis: Secondary | ICD-10-CM | POA: Insufficient documentation

## 2014-03-15 LAB — RAPID STREP SCREEN (MED CTR MEBANE ONLY): Streptococcus, Group A Screen (Direct): POSITIVE — AB

## 2014-03-15 MED ORDER — DEXAMETHASONE 2 MG PO TABS
10.0000 mg | ORAL_TABLET | Freq: Once | ORAL | Status: AC
  Administered 2014-03-15: 10 mg via ORAL
  Filled 2014-03-15: qty 5

## 2014-03-15 MED ORDER — LIDOCAINE VISCOUS 2 % MT SOLN: 20.0000 mL | OROMUCOSAL | Status: DC | PRN

## 2014-03-15 MED ORDER — DEXAMETHASONE 1 MG/ML PO CONC
10.0000 mg | Freq: Once | ORAL | Status: DC
  Filled 2014-03-15: qty 10

## 2014-03-15 MED ORDER — PENICILLIN G BENZATHINE 1200000 UNIT/2ML IM SUSP
1.2000 10*6.[IU] | Freq: Once | INTRAMUSCULAR | Status: AC
  Administered 2014-03-15: 1.2 10*6.[IU] via INTRAMUSCULAR
  Filled 2014-03-15: qty 2

## 2014-03-15 MED ORDER — LIDOCAINE VISCOUS 2 % MT SOLN
15.0000 mL | Freq: Once | OROMUCOSAL | Status: AC
  Administered 2014-03-15: 15 mL via OROMUCOSAL
  Filled 2014-03-15: qty 15

## 2014-03-15 NOTE — ED Provider Notes (Signed)
CSN: [id number]     Arrival date & time 03/15/14  1513 History   This chart was scribed for non-physician practitioner working with Francee Piccolo, by Tana Conch ED Scribe. This patient was seen in TR09C/TR09C and the patient's care was started at 3:45 PM.    Chief Complaint  Patient presents with  . Sore Throat     The history is provided by the patient. No language interpreter was used.    HPI Comments: Emily Horne is a 23 y.o. female who presents to the Emergency Department complaining of a sore throat that has been present for 3 days, it is worsened by swallowing. He endorses associated mild generalized headache. No alleviating factors, but has not tried anything at home. Pt denies fever, congestion, nausea, vomiting, abdominal pain, and cough.   Past Medical History  Diagnosis Date  . Herpes simplex without mention of complication   . Allergy    Past Surgical History  Procedure Laterality Date  . No past surgeries     Family History  Problem Relation Age of Onset  . Diabetes Father   . Asthma Father   . Migraines Mother    History  Substance Use Topics  . Smoking status: Never Smoker   . Smokeless tobacco: Never Used  . Alcohol Use: Yes     Comment: socialy   OB History   Grav Para Term Preterm Abortions TAB SAB Ect Mult Living   1 0 0  1  1   0     Review of Systems  HENT: Positive for sore throat.   All other systems reviewed and are negative.      Allergies  Bactrim  Home Medications   Current Outpatient Rx  Name  Route  Sig  Dispense  Refill  . ibuprofen (ADVIL,MOTRIN) 200 MG tablet   Oral   Take 400-600 mg by mouth every 6 (six) hours as needed for headache.         . sulfamethoxazole-trimethoprim (BACTRIM DS,SEPTRA DS) 800-160 MG per tablet   Oral   Take 1 tablet by mouth 2 (two) times daily.   14 tablet   0   . lidocaine (XYLOCAINE) 2 % solution   Mouth/Throat   Use as directed 20 mLs in the mouth or throat as needed  for mouth pain.   100 mL   0    BP 129/76  Pulse 112  Temp(Src) 97.6 F (36.4 C) (Oral)  Resp 16  Ht 5\' 5"  (1.651 m)  Wt 175 lb (79.379 kg)  BMI 29.12 kg/m2  SpO2 99%  LMP 03/02/2014 Physical Exam  Nursing note and vitals reviewed. Constitutional: She is oriented to person, place, and time. She appears well-developed and well-nourished. No distress.  HENT:  Head: Normocephalic and atraumatic.  Right Ear: External ear normal.  Left Ear: External ear normal.  Nose: Nose normal.  Mouth/Throat: Uvula is midline and mucous membranes are normal. No trismus in the jaw. Oropharyngeal exudate and posterior oropharyngeal erythema present. No posterior oropharyngeal edema or tonsillar abscesses.  Eyes: Conjunctivae are normal.  Neck: Normal range of motion. Neck supple.  Cardiovascular: Normal rate.   Pulmonary/Chest: Effort normal.  Abdominal: Soft.  Musculoskeletal: Normal range of motion.  Lymphadenopathy:    She has cervical adenopathy.  Neurological: She is alert and oriented to person, place, and time.  Skin: Skin is warm and dry. She is not diaphoretic.  Psychiatric: She has a normal mood and affect.    ED Course  Procedures (including critical care time) Medications  penicillin g benzathine (BICILLIN LA) 1200000 UNIT/2ML injection 1.2 Million Units (1.2 Million Units Intramuscular Given 03/15/14 1623)  lidocaine (XYLOCAINE) 2 % viscous mouth solution 15 mL (15 mLs Mouth/Throat Given 03/15/14 1623)  dexamethasone (DECADRON) tablet 10 mg (10 mg Oral Given 03/15/14 1632)     DIAGNOSTIC STUDIES: Oxygen Saturation is 99% on RA, normal by my interpretation.    COORDINATION OF CARE:   3:51 PM-Discussed treatment plan which includes labs with pt at bedside and pt agreed to plan.   Labs Review Labs Reviewed  RAPID STREP SCREEN - Abnormal; Notable for the following:    Streptococcus, Group A Screen (Direct) POSITIVE (*)    All other components within normal limits   Imaging  Review No results found.   EKG Interpretation None      MDM   Final diagnoses:  Strep pharyngitis    Filed Vitals:   03/15/14 1519  BP: 129/76  Pulse: 112  Temp: 97.6 F (36.4 C)  Resp: 16    Afebrile, NAD, non-toxic appearing, AAOx4.  Pt afebrile with tonsillar exudate, cervical lymphadenopathy, & dysphagia; diagnosis of strep. Treated in the Ed with steroids, NSAIDs, and PCN IM.  Pt appears mildly dehydrated, discussed importance of water rehydration. Presentation non concerning for PTA or infxn spread to soft tissue. No trismus or uvula deviation. Specific return precautions discussed. Pt able to drink water in ED without difficulty with intact air way. Recommended PCP follow up. Patient is stable at time of discharge   I personally performed the services described in this documentation, which was scribed in my presence. The recorded information has been reviewed and is accurate.    Jeannetta EllisJennifer L Tiawanna Luchsinger, PA-C 03/15/14 1648

## 2014-03-15 NOTE — Discharge Instructions (Signed)
Please follow up with your primary care physician in 1-2 days. If you do not have one please call the [facility] number listed above. Please alternate between Motrin and Tylenol every three hours for fevers and pain. Please read all discharge instructions and return precautions.  ° ° °Pharyngitis °Pharyngitis is redness, pain, and swelling (inflammation) of your pharynx.  °CAUSES  °Pharyngitis is usually caused by infection. Most of the time, these infections are from viruses (viral) and are part of a cold. However, sometimes pharyngitis is caused by bacteria (bacterial). Pharyngitis can also be caused by allergies. Viral pharyngitis may be spread from person to person by coughing, sneezing, and personal items or utensils (cups, forks, spoons, toothbrushes). Bacterial pharyngitis may be spread from person to person by more intimate contact, such as kissing.  °SIGNS AND SYMPTOMS  °Symptoms of pharyngitis include:   °· Sore throat.   °· Tiredness (fatigue).   °· Low-grade fever.   °· Headache. °· Joint pain and muscle aches. °· Skin rashes. °· Swollen lymph nodes. °· Plaque-like film on throat or tonsils (often seen with bacterial pharyngitis). °DIAGNOSIS  °Your health care provider will ask you questions about your illness and your symptoms. Your medical history, along with a physical exam, is often all that is needed to diagnose pharyngitis. Sometimes, a rapid strep test is done. Other lab tests may also be done, depending on the suspected cause.  °TREATMENT  °Viral pharyngitis will usually get better in 3 4 days without the use of medicine. Bacterial pharyngitis is treated with medicines that kill germs (antibiotics).  °HOME CARE INSTRUCTIONS  °· Drink enough water and fluids to keep your urine clear or pale yellow.   °· Only take over-the-counter or prescription medicines as directed by your health care provider:   °· If you are prescribed antibiotics, make sure you finish them even if you  start to feel better.   °· Do not take aspirin.   °· Get lots of rest.   °· Gargle with 8 oz of salt water (½ tsp of salt per 1 qt of water) as often as every 1 2 hours to soothe your throat.   °· Throat lozenges (if you are not at risk for choking) or sprays may be used to soothe your throat. °SEEK MEDICAL CARE IF:  °· You have large, tender lumps in your neck. °· You have a rash. °· You cough up green, yellow-brown, or bloody spit. °SEEK IMMEDIATE MEDICAL CARE IF:  °· Your neck becomes stiff. °· You drool or are unable to swallow liquids. °· You vomit or are unable to keep medicines or liquids down. °· You have severe pain that does not go away with the use of recommended medicines. °· You have trouble breathing (not caused by a stuffy nose). °MAKE SURE YOU:  °· Understand these instructions. °· Will watch your condition. °· Will get help right away if you are not doing well or get worse. °Document Released: 11/27/2005 Document Revised: 09/17/2013 Document Reviewed: 08/04/2013 °ExitCare® Patient Information ©2014 [employer]. ° ° ° °

## 2014-03-15 NOTE — ED Notes (Signed)
The pt has been  Having throat pain for 3-4 days.  No temp

## 2014-03-17 NOTE — ED Provider Notes (Signed)
Medical screening examination/treatment/procedure(s) were performed by non-physician practitioner and as supervising physician I was immediately available for consultation/collaboration.   Candyce ChurnJohn David Kyrstyn Greear III, MD 03/17/14 1031

## 2014-10-12 ENCOUNTER — Encounter (HOSPITAL_COMMUNITY): Payer: Self-pay | Admitting: Emergency Medicine

## 2014-10-13 ENCOUNTER — Encounter (HOSPITAL_COMMUNITY): Payer: Self-pay | Admitting: Adult Health

## 2014-10-13 DIAGNOSIS — R51 Headache: Secondary | ICD-10-CM | POA: Insufficient documentation

## 2014-10-13 DIAGNOSIS — S8991XA Unspecified injury of right lower leg, initial encounter: Secondary | ICD-10-CM | POA: Insufficient documentation

## 2014-10-13 DIAGNOSIS — R0981 Nasal congestion: Secondary | ICD-10-CM | POA: Insufficient documentation

## 2014-10-13 DIAGNOSIS — J0110 Acute frontal sinusitis, unspecified: Secondary | ICD-10-CM | POA: Insufficient documentation

## 2014-10-13 DIAGNOSIS — Y939 Activity, unspecified: Secondary | ICD-10-CM | POA: Insufficient documentation

## 2014-10-13 DIAGNOSIS — R05 Cough: Secondary | ICD-10-CM | POA: Insufficient documentation

## 2014-10-13 DIAGNOSIS — Z8619 Personal history of other infectious and parasitic diseases: Secondary | ICD-10-CM | POA: Insufficient documentation

## 2014-10-13 DIAGNOSIS — S8992XA Unspecified injury of left lower leg, initial encounter: Secondary | ICD-10-CM | POA: Insufficient documentation

## 2014-10-13 DIAGNOSIS — Y92488 Other paved roadways as the place of occurrence of the external cause: Secondary | ICD-10-CM | POA: Insufficient documentation
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
## 2014-10-14 ENCOUNTER — Emergency Department (HOSPITAL_COMMUNITY)
Admission: EM | Admit: 2014-10-14 | Discharge: 2014-10-14 | Disposition: A | Discharge status: Home / Self Care | Payer: BC Managed Care – PPO | Attending: Emergency Medicine | Admitting: Emergency Medicine

## 2014-10-14 DIAGNOSIS — J0110 Acute frontal sinusitis, unspecified: Secondary | ICD-10-CM

## 2014-10-14 LAB — RAPID STREP SCREEN (MED CTR MEBANE ONLY): Streptococcus, Group A Screen (Direct): NEGATIVE

## 2014-10-14 MED ORDER — IBUPROFEN 800 MG PO TABS: 800.0000 mg | ORAL_TABLET | Freq: Three times a day (TID) | ORAL | Status: DC

## 2014-10-14 MED ORDER — IBUPROFEN 400 MG PO TABS
800.0000 mg | ORAL_TABLET | Freq: Once | ORAL | Status: AC
  Administered 2014-10-14: 800 mg via ORAL
  Filled 2014-10-14: qty 2

## 2014-10-14 MED ORDER — AMOXICILLIN 500 MG PO CAPS: 1000.0000 mg | ORAL_CAPSULE | Freq: Two times a day (BID) | ORAL | Status: DC

## 2014-10-14 MED ORDER — AMOXICILLIN 500 MG PO CAPS
1000.0000 mg | ORAL_CAPSULE | Freq: Once | ORAL | Status: AC
  Administered 2014-10-14: 1000 mg via ORAL
  Filled 2014-10-14: qty 2
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
## 2014-10-14 NOTE — Discharge Instructions (Signed)
Sinusitis °Sinusitis is redness, soreness, and puffiness (inflammation) of the air pockets in the bones of your face (sinuses). The redness, soreness, and puffiness can cause air and mucus to get trapped in your sinuses. This can allow germs to grow and cause an infection.  °HOME CARE  °· Drink enough fluids to keep your pee (urine) clear or pale yellow. °· Use a humidifier in your home. °· Run a hot shower to create steam in the bathroom. Sit in the bathroom with the door closed. Breathe in the steam 3-4 times a day. °· Put a warm, moist washcloth on your face 3-4 times a day, or as told by your doctor. °· Use salt water sprays (saline sprays) to wet the thick fluid in your nose. This can help the sinuses drain. °· Only take medicine as told by your doctor. °GET HELP RIGHT AWAY IF:  °· Your pain gets worse. °· You have very bad headaches. °· You are sick to your stomach (nauseous). °· You throw up (vomit). °· You are very sleepy (drowsy) all the time. °· Your face is puffy (swollen). °· Your vision changes. °· You have a stiff neck. °· You have trouble breathing. °MAKE SURE YOU:  °· Understand these instructions. °· Will watch your condition. °· Will get help right away if you are not doing well or get worse. °Document Released: 05/15/2008 Document Revised: 08/21/2012 Document Reviewed: 07/02/2012 °ExitCare® Patient Information ©2015 [employer]. This information is not intended to replace advice given to you by your health care provider. Make sure you discuss any questions you have with your health care provider. ° °
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
## 2014-10-16 LAB — CULTURE, GROUP A STREP

## 2014-10-18 ENCOUNTER — Telehealth (HOSPITAL_COMMUNITY): Payer: Self-pay

## 2014-10-18 NOTE — Telephone Encounter (Signed)
Post ED Visit - Positive Culture Follow-up  Culture report reviewed by antimicrobial stewardship pharmacist: []  Wes Dulaney, Pharm.D., BCPS []  Celedonio MiyamotoJeremy Frens, Pharm.D., BCPS []  Georgina PillionElizabeth Martin, Pharm.D., BCPS []  DadevilleMinh Pham, VermontPharm.D., BCPS, AAHIVP [x]  Estella HuskMichelle Turner, Pharm.D., BCPS, AAHIVP []  Babs BertinHaley Baird, Pharm.D.   Positive Group A Strep culture Treated with Amoxicillin, organism sensitive to the same and no further patient follow-up is required at this time.  Arvid RightClark, Hansford Hirt Dorn 10/18/2014, 5:08 AM

## 2014-11-27 ENCOUNTER — Encounter (HOSPITAL_COMMUNITY): Payer: Self-pay | Admitting: *Deleted

## 2014-11-27 ENCOUNTER — Emergency Department (HOSPITAL_COMMUNITY)
Admission: EM | Admit: 2014-11-27 | Discharge: 2014-11-27 | Disposition: A | Discharge status: Home / Self Care | Payer: BC Managed Care – PPO | Attending: Emergency Medicine | Admitting: Emergency Medicine

## 2014-11-27 DIAGNOSIS — L293 Anogenital pruritus, unspecified: Secondary | ICD-10-CM | POA: Insufficient documentation

## 2014-11-27 DIAGNOSIS — Z791 Long term (current) use of non-steroidal anti-inflammatories (NSAID): Secondary | ICD-10-CM | POA: Insufficient documentation

## 2014-11-27 DIAGNOSIS — N898 Other specified noninflammatory disorders of vagina: Secondary | ICD-10-CM

## 2014-11-27 DIAGNOSIS — Z792 Long term (current) use of antibiotics: Secondary | ICD-10-CM | POA: Insufficient documentation

## 2014-11-27 DIAGNOSIS — G988 Other disorders of nervous system: Secondary | ICD-10-CM | POA: Insufficient documentation

## 2014-11-27 DIAGNOSIS — Z8619 Personal history of other infectious and parasitic diseases: Secondary | ICD-10-CM | POA: Insufficient documentation

## 2014-11-27 LAB — WET PREP, GENITAL
TRICH WET PREP: NONE SEEN
YEAST WET PREP: NONE SEEN

## 2014-11-27 MED ORDER — CEFTRIAXONE SODIUM 250 MG IJ SOLR
250.0000 mg | Freq: Once | INTRAMUSCULAR | Status: AC
  Administered 2014-11-27: 250 mg via INTRAMUSCULAR
  Filled 2014-11-27: qty 250

## 2014-11-27 MED ORDER — AZITHROMYCIN 250 MG PO TABS
1000.0000 mg | ORAL_TABLET | Freq: Once | ORAL | Status: AC
  Administered 2014-11-27: 1000 mg via ORAL
  Filled 2014-11-27: qty 4

## 2014-11-27 NOTE — Discharge Instructions (Signed)

## 2014-11-27 NOTE — ED Provider Notes (Signed)
CSN: [id number]     Arrival date & time 11/27/14  1000 History   First MD Initiated Contact with Patient 11/27/14 1009     Chief Complaint  Patient presents with  . Exposure to STD     (Consider location/radiation/quality/duration/timing/severity/associated sxs/prior Treatment) HPI  Pt is a 23yo female with h/o HSV and chlamydia who presents with complaint of vaginal odor and painful intercourse. The patient reports that yesterday she was having sex with her boyfriend and noticed a strange odor that she cannot describe. She also reports that she has some labial pain with intercourse that is similar to irritation she has had in the past that was thought to be from frequent intercourse.  She declines HIV testing because she doesn't want to know about anything that serious.  Past Medical History  Diagnosis Date  . Herpes simplex without mention of complication   . Allergy    Past Surgical History  Procedure Laterality Date  . No past surgeries     Family History  Problem Relation Age of Onset  . Diabetes Father   . Asthma Father   . Migraines Mother    History  Substance Use Topics  . Smoking status: Never Smoker   . Smokeless tobacco: Never Used  . Alcohol Use: Yes     Comment: socialy   OB History    Gravida Para Term Preterm AB TAB SAB Ectopic Multiple Living   1 0 0  1  1   0     Review of Systems Denies abdominal pain, vaginal lesions, discharge, dysuria, fevers.   Allergies  Bactrim  Home Medications   Prior to Admission medications   Medication Sig Start Date End Date Taking? Authorizing Provider  amoxicillin (AMOXIL) 500 MG capsule Take 2 capsules (1,000 mg total) by mouth 2 (two) times daily. 10/14/14   Shari A Upstill, PA-C  ibuprofen (ADVIL,MOTRIN) 800 MG tablet Take 1 tablet (800 mg total) by mouth 3 (three) times daily. 10/14/14   Shari A Upstill, PA-C  lidocaine (XYLOCAINE) 2 % solution Use as directed 20 mLs in the mouth or throat as needed for mouth  pain. 03/15/14   Jennifer L Piepenbrink, PA-C   BP 124/82 mmHg  Pulse 114  Temp(Src) 99.8 F (37.7 C) (Oral)  Resp 18  SpO2 98%  LMP  Physical Exam  Constitutional: She is oriented to person, place, and time. She appears well-developed and well-nourished. No distress.  HENT:  Head: Normocephalic and atraumatic.  Eyes: Conjunctivae are normal. Right eye exhibits no discharge. Left eye exhibits no discharge. No scleral icterus.  Cardiovascular: Normal rate.   Pulmonary/Chest: Effort normal.  Neurological: She is alert and oriented to person, place, and time.  Skin: Skin is warm and dry. She is not diaphoretic.  Psychiatric:  Nervous affect  Nursing note and vitals reviewed.   ED Course  Procedures (including critical care time) Labs Review Labs Reviewed  WET PREP, GENITAL  GC/CHLAMYDIA PROBE AMP    Imaging Review No results found.   EKG Interpretation None      MDM   Final diagnoses:  None   Vaginal odor and dyspareunia concerning for possible STI. Will do pelvic with wet prep and GC/CT. No HIV as patient refuses and no RPR as patient reports recently negative and no symptoms of this.  Pelvic exam benign. No significant discharge. No CMT or lesions. Will treat presumptively with ctx and azithro given patient's risk poor f/u.  Abram SanderElena M Kaila Devries, MD 11/27/14 [phone]  Enid SkeensJoshua M Zavitz, MD 11/27/14 [id number]

## 2014-11-27 NOTE — ED Notes (Signed)
Pt states that when she was having sex with her boyfriend that they noticed a "weird smell". Pt states that she had some pain with intercourse as well. Denies any discharge or urinary symptoms.

## 2014-11-28 LAB — GC/CHLAMYDIA PROBE AMP
CT Probe RNA: NEGATIVE
GC Probe RNA: NEGATIVE

## 2015-12-17 ENCOUNTER — Encounter (HOSPITAL_COMMUNITY): Payer: Self-pay | Admitting: *Deleted

## 2015-12-17 ENCOUNTER — Emergency Department (HOSPITAL_COMMUNITY)
Admission: EM | Admit: 2015-12-17 | Discharge: 2015-12-17 | Disposition: A | Discharge status: Home / Self Care | Payer: Medicaid Other | Attending: Emergency Medicine | Admitting: Emergency Medicine

## 2015-12-17 DIAGNOSIS — R000 Tachycardia, unspecified: Secondary | ICD-10-CM | POA: Insufficient documentation

## 2015-12-17 DIAGNOSIS — R197 Diarrhea, unspecified: Secondary | ICD-10-CM | POA: Insufficient documentation

## 2015-12-17 DIAGNOSIS — Z8619 Personal history of other infectious and parasitic diseases: Secondary | ICD-10-CM | POA: Insufficient documentation

## 2015-12-17 DIAGNOSIS — M791 Myalgia: Secondary | ICD-10-CM | POA: Insufficient documentation

## 2015-12-17 DIAGNOSIS — Z792 Long term (current) use of antibiotics: Secondary | ICD-10-CM | POA: Insufficient documentation

## 2015-12-17 DIAGNOSIS — R509 Fever, unspecified: Secondary | ICD-10-CM | POA: Insufficient documentation

## 2015-12-17 DIAGNOSIS — R112 Nausea with vomiting, unspecified: Secondary | ICD-10-CM | POA: Insufficient documentation

## 2015-12-17 LAB — COMPREHENSIVE METABOLIC PANEL
ALBUMIN: 3.8 g/dL (ref 3.5–5.0)
ALK PHOS: 75 U/L (ref 38–126)
ALT: 18 U/L (ref 14–54)
ANION GAP: 11 (ref 5–15)
AST: 25 U/L (ref 15–41)
BUN: <5 mg/dL — ABNORMAL LOW (ref 6–20)
CALCIUM: 9.1 mg/dL (ref 8.9–10.3)
CO2: 27 mmol/L (ref 22–32)
Chloride: 100 mmol/L — ABNORMAL LOW (ref 101–111)
Creatinine, Ser: 0.88 mg/dL (ref 0.44–1.00)
GFR calc non Af Amer: >60 mL/min (ref >60)
GLUCOSE: 103 mg/dL — AB (ref 65–99)
POTASSIUM: 3.3 mmol/L — AB (ref 3.5–5.1)
Sodium: 138 mmol/L (ref 135–145)
TOTAL PROTEIN: 7.9 g/dL (ref 6.5–8.1)
Total Bilirubin: 0.1 mg/dL — ABNORMAL LOW (ref 0.3–1.2)

## 2015-12-17 LAB — LIPASE, BLOOD: Lipase: 23 U/L (ref 11–51)

## 2015-12-17 LAB — CBC
HEMATOCRIT: 43.3 % (ref 36.0–46.0)
HEMOGLOBIN: 13.8 g/dL (ref 12.0–15.0)
MCH: 27.4 pg (ref 26.0–34.0)
MCHC: 31.9 g/dL (ref 30.0–36.0)
MCV: 85.9 fL (ref 78.0–100.0)
Platelets: 296 10*3/uL (ref 150–400)
RBC: 5.04 MIL/uL (ref 3.87–5.11)
RDW: 14 % (ref 11.5–15.5)
WBC: 6.2 10*3/uL (ref 4.0–10.5)

## 2015-12-17 LAB — I-STAT BETA HCG BLOOD, ED (MC, WL, AP ONLY): I-stat hCG, quantitative: <5 m[IU]/mL (ref <5)

## 2015-12-17 MED ORDER — ONDANSETRON 4 MG PO TBDP
4.0000 mg | ORAL_TABLET | Freq: Once | ORAL | Status: AC | PRN
  Administered 2015-12-17: 4 mg via ORAL

## 2015-12-17 MED ORDER — SODIUM CHLORIDE 0.9 % IV BOLUS (SEPSIS)
1000.0000 mL | Freq: Once | INTRAVENOUS | Status: AC
  Administered 2015-12-17: 1000 mL via INTRAVENOUS

## 2015-12-17 MED ORDER — ONDANSETRON 4 MG PO TBDP: 4.0000 mg | ORAL_TABLET | Freq: Three times a day (TID) | ORAL | Status: DC | PRN

## 2015-12-17 MED ORDER — ONDANSETRON 4 MG PO TBDP
ORAL_TABLET | ORAL | Status: AC
  Filled 2015-12-17: qty 1

## 2015-12-17 MED ORDER — ONDANSETRON HCL 4 MG/2ML IJ SOLN
4.0000 mg | Freq: Once | INTRAMUSCULAR | Status: AC
  Administered 2015-12-17: 4 mg via INTRAVENOUS
  Filled 2015-12-17: qty 2

## 2015-12-17 NOTE — ED Provider Notes (Signed)
CSN: [id number]     Arrival date & time 12/17/15  1130 History   First MD Initiated Contact with Patient 12/17/15 1547     Chief Complaint  Patient presents with  . Emesis  . Diarrhea     (Consider location/radiation/quality/duration/timing/severity/associated sxs/prior Treatment) HPI   Emily Horne is a 25 y.o. female, patient with no pertinent past medical history, presenting to the ED with N/V/D accompanied by fever and body aches for the last two days. Tmax was 101F last night. Pt has been taking Tylenol for the fever. Pt has vomited at least 10 times in the last 24 hours, with about two episodes of diarrhea in the same time period. Pt had sick contacts in her family with similar symptoms. Pt denies abdominal pain, shortness of breath, chest pain, cough, LOC, or any other complaints. Patient is accompanied by her parents at the bedside. Patient's last dose of tylenol was around 6 am this morning.     Past Medical History  Diagnosis Date  . Herpes simplex without mention of complication   . Allergy    Past Surgical History  Procedure Laterality Date  . No past surgeries     Family History  Problem Relation Age of Onset  . Diabetes Father   . Asthma Father   . Migraines Mother    Social History  Substance Use Topics  . Smoking status: Never Smoker   . Smokeless tobacco: Never Used  . Alcohol Use: Yes     Comment: socialy   OB History    Gravida Para Term Preterm AB TAB SAB Ectopic Multiple Living   1 0 0  1  1   0     Review of Systems  Constitutional: Positive for fever. Negative for chills and diaphoresis.  Respiratory: Negative for cough and shortness of breath.   Cardiovascular: Negative for chest pain.  Gastrointestinal: Positive for nausea, vomiting and diarrhea. Negative for abdominal pain and blood in stool.  Genitourinary: Negative for dysuria and hematuria.  Musculoskeletal: Positive for myalgias.  Skin: Negative for color change and pallor.   Neurological: Negative for headaches.  All other systems reviewed and are negative.     Allergies  Bactrim  Home Medications   Prior to Admission medications   Medication Sig Start Date End Date Taking? Authorizing Provider  etonogestrel (NEXPLANON) 68 MG IMPL implant 68 mg by Subdermal route once.    Yes Historical Provider, MD  guaiFENesin (MUCINEX) 600 MG 12 hr tablet Take 600 mg by mouth 2 (two) times daily as needed for to loosen phlegm.   Yes Historical Provider, MD  Ibuprofen-Diphenhydramine Cit (ADVIL PM) 200-38 MG TABS Take 2 tablets by mouth daily as needed (for pain).   Yes Historical Provider, MD  Pseudoephedrine-DM-GG-APAP (TYLENOL COLD) 30-15-200-325 MG TABS Take 2 tablets by mouth daily as needed (for cold).   Yes Historical Provider, MD  amoxicillin (AMOXIL) 500 MG capsule Take 2 capsules (1,000 mg total) by mouth 2 (two) times daily. 10/14/14   Shari Upstill, PA-C  ondansetron (ZOFRAN ODT) 4 MG disintegrating tablet Take 1 tablet (4 mg total) by mouth every 8 (eight) hours as needed for nausea or vomiting. 12/17/15   Shawn C Joy, PA-C   BP 118/86 mmHg  Pulse 96  Temp(Src) 98.6 F (37 C) (Oral)  Resp 16  SpO2 96%  LMP 12/10/2015 Physical Exam  Constitutional: She appears well-developed and well-nourished. No distress.  HENT:  Head: Normocephalic and atraumatic.  Eyes: Conjunctivae are normal. Pupils  are equal, round, and reactive to light.  Neck: Normal range of motion. Neck supple.  Cardiovascular: Normal rate, regular rhythm, normal heart sounds and intact distal pulses.   Pulmonary/Chest: Effort normal and breath sounds normal. No respiratory distress.  Abdominal: Soft. Bowel sounds are normal. There is no tenderness.  Musculoskeletal: She exhibits no edema or tenderness.  Lymphadenopathy:    She has no cervical adenopathy.  Neurological: She is alert.  Skin: Skin is warm and dry. She is not diaphoretic.  Nursing note and vitals reviewed.   ED Course   Procedures (including critical care time) Labs Review Labs Reviewed  COMPREHENSIVE METABOLIC PANEL - Abnormal; Notable for the following:    Potassium 3.3 (*)    Chloride 100 (*)    Glucose, Bld 103 (*)    BUN <5 (*)    Total Bilirubin 0.1 (*)    All other components within normal limits  LIPASE, BLOOD  CBC  URINALYSIS, ROUTINE W REFLEX MICROSCOPIC (NOT [facility])  I-STAT BETA HCG BLOOD, ED ([facility], [facility], [facility] ONLY)    I have personally reviewed and evaluated these lab results as part of my medical decision-making.   EKG Interpretation None      MDM   Final diagnoses:  Non-intractable vomiting with nausea, vomiting of unspecified type  Diarrhea, unspecified type    Artis FlockSabrina M Denomme presents with fever, nausea, vomiting, and diarrhea for the last 2 days  This patient's presentation is consistent with a viral illness. Patient is nontoxic appearing, not tachypneic, maintains SPO2 an adequate level, is normotensive, and is in no apparent distress. Patient's tachycardia reduced after IV fluids. After a liter of IV fluids and Zofran patient states that she feels hungry and would like to go home. Patient had a successful fluid challenge and was able to walk unassisted. The patient was given instructions for home care as well as return precautions. Patient voices understanding of these instructions, accepts the plan, and is comfortable with discharge.  Filed Vitals:   12/17/15 1700 12/17/15 1745 12/17/15 1800 12/17/15 1815  BP: 111/79 119/90 113/86 117/89  Pulse: 91 74 80 81  Temp:      TempSrc:      Resp:      SpO2: 98% 96% 98% 95%      Anselm PancoastShawn C Joy, PA-C 12/17/15 1901  Lyndal Pulleyaniel Knott, MD 12/19/15 1224

## 2015-12-17 NOTE — Discharge Instructions (Signed)
You have been seen today for vomiting and diarrhea. Your lab tests showed no abnormalities. Follow up with PCP as needed. Return to ED should symptoms worsen. Be sure to drink plenty of fluids and get plenty of rest. Tylenol for pain or fever. Zofran for nausea.    Emergency Department Resource Guide 1) Find a Doctor and Pay Out of Pocket Although you won't have to find out who is covered by your insurance plan, it is a good idea to ask around and get recommendations. You will then need to call the office and see if the doctor you have chosen will accept you as a new patient and what types of options they offer for patients who are self-pay. Some doctors offer discounts or will set up payment plans for their patients who do not have insurance, but you will need to ask so you aren't surprised when you get to your appointment.  2) Contact Your Local Health Department Not all health departments have doctors that can see patients for sick visits, but many do, so it is worth a call to see if yours does. If you don't know where your local health department is, you can check in your phone book. The CDC also has a tool to help you locate your state's health department, and many state websites also have listings of all of their local health departments.  3) Find a Walk-in Clinic If your illness is not likely to be very severe or complicated, you may want to try a walk in clinic. These are popping up all over the country in pharmacies, drugstores, and shopping centers. They're usually staffed by nurse practitioners or physician assistants that have been trained to treat common illnesses and complaints. They're usually fairly quick and inexpensive. However, if you have serious medical issues or chronic medical problems, these are probably not your best option.  No Primary Care Doctor: - Call Health Connect at  [phone] - they can help you locate a primary care doctor that  accepts your insurance, provides certain  services, etc. - Physician Referral Service- [phone]  Chronic Pain Problems: Organization         Address  Phone   Notes  [facility]  [phone] Patients need to be referred by their primary care doctor.   Medication Assistance: Organization         Address  Phone   Notes  [facility] [address] [place], [place] [id number] [phone]) [phone] --Must be a resident of [facility] [place] -- Must have NO insurance coverage whatsoever (no Medicaid/ Medicare, etc.) -- The pt. MUST have a primary care doctor that directs their care regularly and follows them in the community   MedAssist  [phone]   [employer]  [phone]    Agencies that provide inexpensive medical care: Organization         Address  Phone   Notes  [facility]  [phone]   [facility]    [phone]   [facility] [address] [place], [place] [address] [phone]   [facility] [address], [place] [phone]   [facility]anned Parenthood    [phone]   [facility]    [phone]   [facility]  [address], [place] Phone:  [phone], Fax:  [phone] Hours of Operation:  9 am -  6 pm, M-F.  Also accepts Medicaid/Medicare and self-pay.  [facility] [place], [address], [place] Phone: [phone], Fax: [phone]. Hours of Operation:  8:30 am - 5:30 pm, M-F.  Also accepts Medicaid and self-pay.  [facility] [place] [address] [place] Phone: [phone]   [place], [place], [place] [phone], Ext. [phone] Mondays & Thursdays: 7-9 AM.  First 15 patients are seen on a first come, first serve basis.    [place] Providers:  Organization         Address  Phone   Notes  [facility] [address], [place] [phone] Also accepts self-pay patients.  [facility] [address], [place]  [phone]   [place], [address], [place] [phone]   [facility] [address] [place] [phone]   Lucianne Lei [address], [place]   [phone] Only accepts [place] Access [place] patients after they have their name applied to their card.   Self-Pay (no insurance) in [facility]:  Organization         Address  Phone   Notes  Sickle Cell Patients, [facility] [place] [phone]   [facility] [place] [phone]   [facility] [place]  [place], [place],  [phone]   [facility]/Dr. Osei-Bonsu  [address] [place] or [address], [place] [phone] Phone number for both [place] and [place] locations is the same.  [facility] [address] [place] [phone]   [facility] [address], [place] or [address] [phone]   [facility] [address], [place] [phone], phone; [phone], fax Sees patients 1st and 3rd Saturday of every month.  Must not qualify for public or private insurance (i.e. Medicaid, Medicare, [place] Health Choice, Veterans' Benefits)  Household income should be no more than 200% of the poverty level The clinic cannot treat you if you are pregnant or think you are pregnant  Sexually transmitted diseases are not treated at the clinic.    Dental Care: Organization         Address  Phone  Notes  [facility] [place] [phone] Accepts  children up to age 4 who are enrolled in [place] or [place]; pregnant women with a Medicaid card; and children who have applied for Medicaid or [place] Health Choice, but were declined, whose parents can pay a reduced fee at time of service.  [facility]  [address], [place] [phone] Accepts children up to age 26 who are enrolled in [place] or [place]; pregnant women with a Medicaid card; and children who have applied for Medicaid or  Health Choice, but were declined, whose parents can pay a reduced fee at time of service.  [place] [facility] Access PROGRAM  [place] [phone] Patients are seen by appointment only. Walk-ins are not accepted. [place] will see patients 72 years of age and  older. Monday - Tuesday (8am-5pm) Most Wednesdays (8:30-5pm) $30 per visit, cash only  [facility] [employer] PROGRAM  [address], [facility] [phone] Patients are seen by appointment only. Walk-ins are not accepted. [place] will see patients 87 years of age and older. One Wednesday Evening (Monthly: Volunteer Based).  $30 per visit, cash only  [place]  [phone] for adults; Children under age 77, call Graduate Pediatric Dentistry at [phone]. Children aged 21-14, please call [phone] to request a pediatric application.  Dental services are provided in all areas of dental care including fillings, crowns and bridges, complete and partial dentures, implants, gum treatment, root canals, and extractions. Preventive care is also provided. Treatment is provided to both adults and children. Patients are selected via a lottery and there is often a waiting list.   [facility] [address], [place]  [phone] [email or web address]   [facility] [address] [place], [place] [phone], Ext. [phone] Second and  Fourth Thursday of each month, opens at 6:30 AM; Clinic ends at 9 AM.  Patients are seen on a first-come first-served basis, and a limited number are seen during each clinic.   [facility]  [address] Hillard Danker [place], [place] [phone]   Eligibility Requirements You must have lived in [place], [place], or [place] counties for at least the last three months.   You cannot be eligible for state or federal sponsored [employer], including [employer], [place], or [employer].   You generally cannot be eligible for healthcare insurance through your employer.    How to apply: Eligibility screenings are held every Tuesday and Wednesday afternoon from 1:00 pm until 4:00 pm. You do not need an appointment for the interview!  [facility] [address] [place] Lopez [place], [place]   [place]  [place] Department  [place]  [phone]    Behavioral Health Resources in the Community: Intensive Outpatient Programs Organization         Address  Phone  Notes  Bryant Seguin. [address], [place], [place] [phone]   [facility] [address], [place], [place]   ADS: [facility] S[facility] [address] [place], [place]   [place] [address]  [place], [place] or [phone]   Substance Abuse Resources Organization         Address  Phone  Notes  Alcohol and Drug Services  [phone]   [place]  [phone]   [facility] [place]   Chinita Pester  [phone]   Residential & Outpatient Substance Abuse Program  [phone]   Psychological Services Organization         Address  Phone  Notes  [facility] [place]  [place]  [phone]   [place]  [address] [place] [phone] or [phone]    Mobile Crisis Teams Organization         Address  Phone  Notes  [facility], Mobile Crisis Care Unit  [phone]   [facility]  [address] [place], [place]   [facility] [address], [place] [phone]    Self-Help/Support Groups Organization         Address  Phone             Notes  [facility] [place] - variety of support groups  [place] Call for more information  Narcotics Anonymous (NA), [facility] [address], [employer]   2 meetings at this location   Special educational needs teacher         Address  Phone  Notes  [facility] Residential Treatment [place],    [place]  [phone]   [facility]  [address], [place] [address], [place], [place]   [place], [place] [phone] Admissions: 8am-3pm M-F  Incentives Substance [place] [address],    [place], [place] [phone]   [facility] [address] [place], [place], [place]   [facility] [address],  [place], [place]   [facility] - Intensive Outpatient [address], [place], [place]   [facility] [place] ([address] [place]  [place], [place] [phone] or [phone]   [facility] (RTS) [address] [place], [place] Accepts Medicaid  [facility] [place] [address],  [place] [phone] Substance Abuse/Addiction Treatment   [facility] Lopez Organization         Address  Phone  Notes  [facility]  [phone]   Domenic Schwab, PhD [address] Arlis Porta [place], [place]   [phone] or [phone]   [place], [place] [phone]   [facility] [address] [place], [place] [phone] Insurance/Medicaid/sponsorship through [facility] and Families [address], [address]                                    [place], [place] [phone] [place] [address], [place] [phone]    Dr. Adele Schilder  [phone]   [facility] Dept. 1) [address] [place] 2) [place] 3)  [place] [address], [place] [phone]  [phone]   [place] [phone] or [phone] (After Hours)

## 2015-12-17 NOTE — ED Notes (Signed)
Pt tolerating ice and water; PA aware

## 2015-12-17 NOTE — ED Notes (Signed)
Pt reports n/v/d and fever x 2 days.

## 2016-10-17 ENCOUNTER — Encounter (HOSPITAL_COMMUNITY): Payer: Self-pay | Admitting: Emergency Medicine

## 2016-10-17 ENCOUNTER — Emergency Department (HOSPITAL_COMMUNITY)
Admission: EM | Admit: 2016-10-17 | Discharge: 2016-10-17 | Disposition: A | Discharge status: Home / Self Care | Payer: Medicaid Other | Attending: Emergency Medicine | Admitting: Emergency Medicine

## 2016-10-17 DIAGNOSIS — H1031 Unspecified acute conjunctivitis, right eye: Secondary | ICD-10-CM | POA: Insufficient documentation

## 2016-10-17 MED ORDER — ERYTHROMYCIN 5 MG/GM OP OINT: TOPICAL_OINTMENT | OPHTHALMIC | 0 refills | Status: DC

## 2016-10-17 NOTE — ED Provider Notes (Signed)
[facility]PT Provider Note   CSN: [id number] Arrival date & time: 10/17/16  [id number]     History   Chief Complaint Chief Complaint  Patient presents with  . Eye Problem    HPI Emily Horne is a 25 y.o. female.  Patient presents to the ED with a chief complaint of right eye irritation.  She states that her sister has pink eye and she thinks that she is getting it too.  She denies any vision changes.  She does report having an itchy and watery eye.  She denies any fevers, or chills.  Denies any pain with movement of the eye.  Denies any injury or FB.  There are no other associated symptoms.  She has not tried taking anything for her symptoms.   The history is provided by the patient. No language interpreter was used.    Past Medical History:  Diagnosis Date  . Allergy   . Herpes simplex without mention of complication     Patient Active Problem List   Diagnosis Date Noted  . Laceration of labia--1st degree, right, due to trauma 11/26/2013  . Well adult exam 05/16/2013  . 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 History of chlamydia infection 03/13/2013  . ACNE 12/25/2008  . ALLERGIC RHINITIS 03/25/2008    Past Surgical History:  Procedure Laterality Date  . NO PAST SURGERIES      OB History    Gravida Para Term Preterm AB Living   1 0 0   1 0   SAB TAB Ectopic Multiple Live Births   1               Home Medications    Prior to Admission medications   Medication Sig Start Date End Date Taking? Authorizing Provider  amoxicillin (AMOXIL) 500 MG capsule Take 2 capsules (1,000 mg total) by mouth 2 (two) times daily. 10/14/14   Elpidio AnisShari Upstill, PA-C  erythromycin ophthalmic ointment Place a 1/2 inch ribbon of ointment into the lower eyelid of both eyes 4 x daily. 10/17/16   Roxy Horsemanobert Charyl Minervini, PA-C  etonogestrel (NEXPLANON) 68 MG IMPL implant 68 mg by Subdermal route once.     Historical Provider, MD  guaiFENesin (MUCINEX) 600 MG 12 hr tablet Take 600 mg by mouth 2 (two)  times daily as needed for to loosen phlegm.    Historical Provider, MD  Ibuprofen-Diphenhydramine Cit (ADVIL PM) 200-38 MG TABS Take 2 tablets by mouth daily as needed (for pain).    Historical Provider, MD  ondansetron (ZOFRAN ODT) 4 MG disintegrating tablet Take 1 tablet (4 mg total) by mouth every 8 (eight) hours as needed for nausea or vomiting. 12/17/15   Shawn C Joy, PA-C  Pseudoephedrine-DM-GG-APAP (TYLENOL COLD) 30-15-200-325 MG TABS Take 2 tablets by mouth daily as needed (for cold).    Historical Provider, MD    Family History Family History  Problem Relation Age of Onset  . Diabetes Father   . Asthma Father   . Migraines Mother     Social History Social History  Substance Use Topics  . Smoking status: Never Smoker  . Smokeless tobacco: Never Used  . Alcohol use Yes     Comment: socialy     Allergies   Bactrim [sulfamethoxazole-trimethoprim]   Review of Systems Review of Systems  Constitutional: Negative for fever.  Eyes: Positive for discharge, redness and itching.  Neurological: Negative for headaches.     Physical Exam Updated Vital Signs BP 113/81 (BP Location: Right Arm)   Pulse  96   Temp 98.4 F (36.9 C) (Oral)   Resp 14   SpO2 97%   Physical Exam  Constitutional: She is oriented to person, place, and time. She appears well-developed and well-nourished.  HENT:  Head: Normocephalic and atraumatic.  Eyes: Conjunctivae and EOM are normal.  Mild right eye conjunctival erythema and clear watery discharge, no pain with eye movement, no evidence of injury, FB, or periorbital/orbital cellulitis  Neck: Normal range of motion.  Cardiovascular: Normal rate.   Pulmonary/Chest: Effort normal.  Abdominal: She exhibits no distension.  Musculoskeletal: Normal range of motion.  Neurological: She is alert and oriented to person, place, and time.  Skin: Skin is dry.  Psychiatric: She has a normal mood and affect. Her behavior is normal. Judgment and thought  content normal.  Nursing note and vitals reviewed.    ED Treatments / Results  Labs (all labs ordered are listed, but only abnormal results are displayed) Labs Reviewed - No data to display  EKG  EKG Interpretation None       Radiology No results found.  Procedures Procedures (including critical care time)  Medications Ordered in ED Medications - No data to display   Initial Impression / Assessment and Plan / ED Course  I have reviewed the triage vital signs and the nursing notes.  Pertinent labs & imaging results that were available during my care of the patient were reviewed by me and considered in my medical decision making (see chart for details).  Clinical Course     Patient with conjunctivitis of right eye.  Sister here with the same.  Doesn't wear contacts.  Allergy to bactrim.  Will give romycin.  Final Clinical Impressions(s) / ED Diagnoses   Final diagnoses:  Acute conjunctivitis of right eye, unspecified acute conjunctivitis type    New Prescriptions Discharge Medication List as of 10/17/2016  9:07 AM    START taking these medications   Details  erythromycin ophthalmic ointment Place a 1/2 inch ribbon of ointment into the lower eyelid of both eyes 4 x daily., Print         Roxy HorsemanRobert Aashka Salomone, PA-C 10/17/16 0933    Laurence Spatesachel Morgan Little, MD 10/21/16 [phone]

## 2016-12-12 ENCOUNTER — Encounter (HOSPITAL_COMMUNITY): Payer: Self-pay | Admitting: Emergency Medicine

## 2016-12-12 ENCOUNTER — Emergency Department (HOSPITAL_COMMUNITY)
Admission: EM | Admit: 2016-12-12 | Discharge: 2016-12-12 | Disposition: A | Discharge status: Home / Self Care | Payer: Medicaid Other | Attending: Emergency Medicine | Admitting: Emergency Medicine

## 2016-12-12 DIAGNOSIS — R197 Diarrhea, unspecified: Secondary | ICD-10-CM | POA: Insufficient documentation

## 2016-12-12 DIAGNOSIS — Z79899 Other long term (current) drug therapy: Secondary | ICD-10-CM | POA: Insufficient documentation

## 2016-12-12 DIAGNOSIS — R112 Nausea with vomiting, unspecified: Secondary | ICD-10-CM | POA: Insufficient documentation

## 2016-12-12 LAB — COMPREHENSIVE METABOLIC PANEL
ALBUMIN: 3.7 g/dL (ref 3.5–5.0)
ALT: 14 U/L (ref 14–54)
ANION GAP: 11 (ref 5–15)
AST: 16 U/L (ref 15–41)
Alkaline Phosphatase: 66 U/L (ref 38–126)
BUN: 5 mg/dL — ABNORMAL LOW (ref 6–20)
CO2: 23 mmol/L (ref 22–32)
Calcium: 9.2 mg/dL (ref 8.9–10.3)
Chloride: 103 mmol/L (ref 101–111)
Creatinine, Ser: 0.83 mg/dL (ref 0.44–1.00)
GFR calc Af Amer: >60 mL/min (ref >60)
GFR calc non Af Amer: >60 mL/min (ref >60)
Glucose, Bld: 105 mg/dL — ABNORMAL HIGH (ref 65–99)
Potassium: 3.5 mmol/L (ref 3.5–5.1)
SODIUM: 137 mmol/L (ref 135–145)
Total Bilirubin: 0.4 mg/dL (ref 0.3–1.2)
Total Protein: 7.4 g/dL (ref 6.5–8.1)

## 2016-12-12 LAB — CBC WITH DIFFERENTIAL/PLATELET
BASOS PCT: 0 %
Basophils Absolute: 0 10*3/uL (ref 0.0–0.1)
Eosinophils Absolute: 0 10*3/uL (ref 0.0–0.7)
Eosinophils Relative: 0 %
HCT: 41.5 % (ref 36.0–46.0)
HEMOGLOBIN: 13.6 g/dL (ref 12.0–15.0)
LYMPHS PCT: 20 %
Lymphs Abs: 1.5 10*3/uL (ref 0.7–4.0)
MCH: 28.6 pg (ref 26.0–34.0)
MCHC: 32.8 g/dL (ref 30.0–36.0)
MCV: 87.2 fL (ref 78.0–100.0)
MONOS PCT: 9 %
Monocytes Absolute: 0.7 10*3/uL (ref 0.1–1.0)
NEUTROS ABS: 5.1 10*3/uL (ref 1.7–7.7)
NEUTROS PCT: 71 %
Platelets: 314 10*3/uL (ref 150–400)
RBC: 4.76 MIL/uL (ref 3.87–5.11)
RDW: 14.3 % (ref 11.5–15.5)
WBC: 7.3 10*3/uL (ref 4.0–10.5)

## 2016-12-12 LAB — URINALYSIS, ROUTINE W REFLEX MICROSCOPIC
BILIRUBIN URINE: NEGATIVE
GLUCOSE, UA: NEGATIVE mg/dL
Hgb urine dipstick: NEGATIVE
KETONES UR: 20 mg/dL — AB
LEUKOCYTES UA: NEGATIVE
Nitrite: NEGATIVE
PROTEIN: 30 mg/dL — AB
Specific Gravity, Urine: 1.028 (ref 1.005–1.030)
pH: 5 (ref 5.0–8.0)

## 2016-12-12 LAB — PREGNANCY, URINE: PREG TEST UR: NEGATIVE

## 2016-12-12 LAB — LIPASE, BLOOD: Lipase: 16 U/L (ref 11–51)

## 2016-12-12 MED ORDER — SODIUM CHLORIDE 0.9 % IV BOLUS (SEPSIS)
1000.0000 mL | Freq: Once | INTRAVENOUS | Status: AC
  Administered 2016-12-12: 1000 mL via INTRAVENOUS

## 2016-12-12 MED ORDER — ONDANSETRON HCL 4 MG/2ML IJ SOLN
4.0000 mg | Freq: Once | INTRAMUSCULAR | Status: AC
  Administered 2016-12-12: 4 mg via INTRAVENOUS
  Filled 2016-12-12: qty 2

## 2016-12-12 MED ORDER — ONDANSETRON 4 MG PO TBDP: 4.0000 mg | ORAL_TABLET | Freq: Four times a day (QID) | ORAL | 0 refills | Status: DC | PRN

## 2016-12-12 NOTE — ED Provider Notes (Signed)
[facility] Provider Note   CSN: [id number] Arrival date & time: 12/12/16  [id number]     History   Chief Complaint Chief Complaint  Patient presents with  . GI Problem    HPI Emily Horne is a 26 y.o. female.  Pt presents to the ED today with n/v/d.  The pt said that is started yesterday morning around 0500.  She was able to eat last night, but she threw it back up this morning.  She has a little stomach cramping, but not fevers or chills.  No known sick contacts.  No recent travel or abx.      Past Medical History:  Diagnosis Date  . Allergy   . Herpes simplex without mention of complication     Patient Active Problem List   Diagnosis Date Noted  . Laceration of labia--1st degree, right, due to trauma 11/26/2013  . Well adult exam 05/16/2013  . 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 History of chlamydia infection 03/13/2013  . ACNE 12/25/2008  . ALLERGIC RHINITIS 03/25/2008    Past Surgical History:  Procedure Laterality Date  . NO PAST SURGERIES      OB History    Gravida Para Term Preterm AB Living   1 0 0   1 0   SAB TAB Ectopic Multiple Live Births   1               Home Medications    Prior to Admission medications   Medication Sig Start Date End Date Taking? Authorizing Provider  amoxicillin (AMOXIL) 500 MG capsule Take 2 capsules (1,000 mg total) by mouth 2 (two) times daily. 10/14/14   Elpidio AnisShari Upstill, PA-C  erythromycin ophthalmic ointment Place a 1/2 inch ribbon of ointment into the lower eyelid of both eyes 4 x daily. 10/17/16   Roxy Horsemanobert Browning, PA-C  etonogestrel (NEXPLANON) 68 MG IMPL implant 68 mg by Subdermal route once.     Historical Provider, MD  guaiFENesin (MUCINEX) 600 MG 12 hr tablet Take 600 mg by mouth 2 (two) times daily as needed for to loosen phlegm.    Historical Provider, MD  Ibuprofen-Diphenhydramine Cit (ADVIL PM) 200-38 MG TABS Take 2 tablets by mouth daily as needed (for pain).    Historical Provider, MD  ondansetron (ZOFRAN  ODT) 4 MG disintegrating tablet Take 1 tablet (4 mg total) by mouth every 6 (six) hours as needed for nausea or vomiting. 12/12/16   Jacalyn LefevreJulie Tomia Enlow, MD  Pseudoephedrine-DM-GG-APAP (TYLENOL COLD) 30-15-200-325 MG TABS Take 2 tablets by mouth daily as needed (for cold).    Historical Provider, MD    Family History Family History  Problem Relation Age of Onset  . Diabetes Father   . Asthma Father   . Migraines Mother     Social History Social History  Substance Use Topics  . Smoking status: Never Smoker  . Smokeless tobacco: Never Used  . Alcohol use Yes     Comment: socialy     Allergies   Bactrim [sulfamethoxazole-trimethoprim]   Review of Systems Review of Systems  Gastrointestinal: Positive for abdominal pain, diarrhea, nausea and vomiting.  All other systems reviewed and are negative.    Physical Exam Updated Vital Signs BP 115/83   Pulse 82   Temp 98.5 F (36.9 C) (Oral)   Resp 16   Ht 5\' 6"  (1.676 m)   SpO2 98%   Physical Exam  Constitutional: She is oriented to person, place, and time. She appears well-developed and well-nourished.  HENT:  Head: Normocephalic and atraumatic.  Right Ear: External ear normal.  Left Ear: External ear normal.  Nose: Nose normal.  Mouth/Throat: Mucous membranes are dry.  Eyes: Conjunctivae and EOM are normal. Pupils are equal, round, and reactive to light.  Neck: Normal range of motion. Neck supple.  Cardiovascular: Normal rate, regular rhythm, normal heart sounds and intact distal pulses.   Pulmonary/Chest: Effort normal and breath sounds normal.  Abdominal: Soft. Bowel sounds are normal.  Musculoskeletal: Normal range of motion.  Neurological: She is alert and oriented to person, place, and time.  Skin: Skin is warm.  Psychiatric: She has a normal mood and affect. Her behavior is normal. Judgment and thought content normal.  Nursing note and vitals reviewed.    ED Treatments / Results  Labs (all labs ordered are  listed, but only abnormal results are displayed) Labs Reviewed  COMPREHENSIVE METABOLIC PANEL - Abnormal; Notable for the following:       Result Value   Glucose, Bld 105 (*)    BUN 5 (*)    All other components within normal limits  URINALYSIS, ROUTINE W REFLEX MICROSCOPIC - Abnormal; Notable for the following:    APPearance HAZY (*)    Ketones, ur 20 (*)    Protein, ur 30 (*)    Bacteria, UA RARE (*)    Squamous Epithelial / LPF 6-30 (*)    All other components within normal limits  CBC WITH DIFFERENTIAL/PLATELET  PREGNANCY, URINE  LIPASE, BLOOD    EKG  EKG Interpretation None       Radiology No results found.  Procedures Procedures (including critical care time)  Medications Ordered in ED Medications  sodium chloride 0.9 % bolus 1,000 mL (1,000 mLs Intravenous New Bag/Given 12/12/16 0732)  ondansetron (ZOFRAN) injection 4 mg (4 mg Intravenous Given 12/12/16 0732)     Initial Impression / Assessment and Plan / ED Course  I have reviewed the triage vital signs and the nursing notes.  Pertinent labs & imaging results that were available during my care of the patient were reviewed by me and considered in my medical decision making (see chart for details).  Clinical Course     Pt is feeling much better after 1L ns and 4 mg of zofran.  She is able to tolerate po fluids.  She knows to return if worse.  Final Clinical Impressions(s) / ED Diagnoses   Final diagnoses:  Nausea vomiting and diarrhea    New Prescriptions New Prescriptions   ONDANSETRON (ZOFRAN ODT) 4 MG DISINTEGRATING TABLET    Take 1 tablet (4 mg total) by mouth every 6 (six) hours as needed for nausea or vomiting.     Jacalyn Lefevre, MD 12/12/16 [phone]

## 2016-12-12 NOTE — ED Triage Notes (Signed)
Pt presents with n/v/d that started yesterday AM around 0500.  Woke her from sleep at 0300 today.

## 2017-07-01 ENCOUNTER — Encounter (HOSPITAL_COMMUNITY): Payer: Self-pay | Admitting: Emergency Medicine

## 2017-07-01 ENCOUNTER — Emergency Department (HOSPITAL_COMMUNITY)
Admission: EM | Admit: 2017-07-01 | Discharge: 2017-07-01 | Disposition: A | Discharge status: Home / Self Care | Payer: Medicaid Other | Attending: Emergency Medicine | Admitting: Emergency Medicine

## 2017-07-01 DIAGNOSIS — Z79899 Other long term (current) drug therapy: Secondary | ICD-10-CM | POA: Insufficient documentation

## 2017-07-01 DIAGNOSIS — J029 Acute pharyngitis, unspecified: Secondary | ICD-10-CM | POA: Insufficient documentation

## 2017-07-01 LAB — RAPID STREP SCREEN (MED CTR MEBANE ONLY): STREPTOCOCCUS, GROUP A SCREEN (DIRECT): NEGATIVE

## 2017-07-01 NOTE — ED Triage Notes (Signed)
Pt sts sore throat x 2 days  

## 2017-07-01 NOTE — Discharge Instructions (Signed)
Your rapid strep test came back negative. We have sent your sample for culture. If it comes back positive you will be contacted in the next 2-3 days. Take ibuprofen or Tylenol for pain. Salt water gargles. Try Chloraseptic throat sprays for pain. Follow-up with a family doctor as needed.

## 2017-07-01 NOTE — ED Provider Notes (Signed)
[facility] Provider Note   CSN: [id number] Arrival date & time: 07/01/17  1430  By signing my name below, I, Rosario AdieWilliam Andrew Hiatt, attest that this documentation has been prepared under the direction and in the presence of Yianna Tersigni, PA-C.  Electronically Signed: Rosario AdieWilliam Andrew Hiatt, ED Scribe. 07/01/17. 2:58 PM.  History   Chief Complaint Chief Complaint  Patient presents with  . Sore Throat   The history is provided by the patient. No language interpreter was used.    HPI Comments: Emily Horne is a 26 y.o. female w/ a h/o seasonal allergies, who presents to the Emergency Department complaining of persistent, worsening sore throat beginning two days ago. She reports associated rhinorrhea and sneezing. Pt has been taking OTC allergy medications at home w/o relief of her symptoms. Pt is able to tolerate their own secretions, but pain is exacerbated w/ swallowing. Pt is able to tolerate PO. She denies fever, cough, loss of appetite, or any other associated symptoms.   Past Medical History:  Diagnosis Date  . Allergy   . Herpes simplex without mention of complication    Patient Active Problem List   Diagnosis Date Noted  . Laceration of labia--1st degree, right, due to trauma 11/26/2013  . Well adult exam 05/16/2013  . 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 History of chlamydia infection 03/13/2013  . ACNE 12/25/2008  . ALLERGIC RHINITIS 03/25/2008   Past Surgical History:  Procedure Laterality Date  . NO PAST SURGERIES     OB History    Gravida Para Term Preterm AB Living   1 0 0   1 0   SAB TAB Ectopic Multiple Live Births   1             Home Medications    Prior to Admission medications   Medication Sig Start Date End Date Taking? Authorizing Provider  amoxicillin (AMOXIL) 500 MG capsule Take 2 capsules (1,000 mg total) by mouth 2 (two) times daily. 10/14/14   Elpidio AnisUpstill, Shari, PA-C  erythromycin ophthalmic ointment Place a 1/2 inch ribbon of ointment  into the lower eyelid of both eyes 4 x daily. 10/17/16   Roxy HorsemanBrowning, Robert, PA-C  etonogestrel (NEXPLANON) 68 MG IMPL implant 68 mg by Subdermal route once.     [provider]  guaiFENesin (MUCINEX) 600 MG 12 hr tablet Take 600 mg by mouth 2 (two) times daily as needed for to loosen phlegm.    [provider]  Ibuprofen-Diphenhydramine Cit (ADVIL PM) 200-38 MG TABS Take 2 tablets by mouth daily as needed (for pain).    [provider]  ondansetron (ZOFRAN ODT) 4 MG disintegrating tablet Take 1 tablet (4 mg total) by mouth every 6 (six) hours as needed for nausea or vomiting. 12/12/16   Jacalyn LefevreHaviland, Julie, MD  Pseudoephedrine-DM-GG-APAP (TYLENOL COLD) 30-15-200-325 MG TABS Take 2 tablets by mouth daily as needed (for cold).    [provider]   Family History Family History  Problem Relation Age of Onset  . Diabetes Father   . Asthma Father   . Migraines Mother    Social History Social History  Substance Use Topics  . Smoking status: Never Smoker  . Smokeless tobacco: Never Used  . Alcohol use Yes     Comment: socialy   Allergies   Bactrim [sulfamethoxazole-trimethoprim]  Review of Systems Review of Systems  Constitutional: Negative for chills and fever.  HENT: Positive for congestion, rhinorrhea, sneezing and sore throat. Negative for trouble swallowing and voice change.  Respiratory: Negative for cough.   Allergic/Immunologic: Positive for environmental allergies.  Neurological: Negative for headaches.  All other systems reviewed and are negative.  Physical Exam Updated Vital Signs BP (!) 125/92 (BP Location: Right Arm)   Pulse 96   Temp 98.5 F (36.9 C) (Oral)   Resp 18   SpO2 96%   Physical Exam  Constitutional: She appears well-developed and well-nourished. No distress.  HENT:  Head: Normocephalic and atraumatic.  Normal external ears, ear canals, TMs bilaterally. Nose normal. Oropharynx erythematous, tonsils mildly enlarged, equal  bilaterally. Uvula is midline.  Eyes: Conjunctivae are normal.  Neck: Normal range of motion.  Cardiovascular: Normal rate.   Pulmonary/Chest: Effort normal.  Abdominal: She exhibits no distension.  Musculoskeletal: Normal range of motion.  Neurological: She is alert.  Skin: No pallor.  Psychiatric: She has a normal mood and affect. Her behavior is normal.  Nursing note and vitals reviewed.  ED Treatments / Results  DIAGNOSTIC STUDIES: Oxygen Saturation is 96% on RA, normal by my interpretation.   COORDINATION OF CARE: 2:58 PM-Discussed next steps with pt. Pt verbalized understanding and is agreeable with the plan.   Labs (all labs ordered are listed, but only abnormal results are displayed) Labs Reviewed  RAPID STREP SCREEN (NOT AT [facility])   EKG  EKG Interpretation None      Radiology No results found.  Procedures Procedures   Medications Ordered in ED Medications - No data to display  Initial Impression / Assessment and Plan / ED Course  I have reviewed the triage vital signs and the nursing notes.  Pertinent labs & imaging results that were available during my care of the patient were reviewed by me and considered in my medical decision making (see chart for details).     Patient in emergency department with sore throat. History of strep throat, states feels the same. She is afebrile, nontoxic-appearing. Normal vital signs. Oropharynx is erythematous, tonsils are equal in size bilaterally, uvula midline. No concern about peritonsillar abscess. She speaking with normal voice, no difficulty swallowing. Rapid strep is negative. Suspect she may have viral pharyngitis. Advised to take Tylenol and Motrin for her pain. Salt water gargles. Chloraseptic throat for pain relief as well. Advised her that her cultures are pending and she will be notified of the comeback abnormal. Will have a follow-up with family doctor as needed. Return precautions discussed.  Vitals:    07/01/17 1435  BP: (!) 125/92  Pulse: 96  Resp: 18  Temp: 98.5 F (36.9 C)  TempSrc: Oral  SpO2: 96%     Final Clinical Impressions(s) / ED Diagnoses   Final diagnoses:  Acute pharyngitis, unspecified etiology   New Prescriptions Discharge Medication List as of 07/01/2017  3:50 PM     I personally performed the services described in this documentation, which was scribed in my presence. The recorded information has been reviewed and is accurate.     Jaynie Crumble, PA-C 07/01/17 1648    Mancel Bale, MD 07/01/17 1754

## 2017-07-02 LAB — CULTURE, GROUP A STREP (THRC)

## 2017-07-21 ENCOUNTER — Encounter (HOSPITAL_COMMUNITY): Payer: Self-pay | Admitting: Emergency Medicine

## 2017-07-21 ENCOUNTER — Emergency Department (HOSPITAL_COMMUNITY)
Admission: EM | Admit: 2017-07-21 | Discharge: 2017-07-21 | Disposition: A | Discharge status: Home / Self Care | Payer: Medicaid Other | Attending: Emergency Medicine | Admitting: Emergency Medicine

## 2017-07-21 ENCOUNTER — Emergency Department (HOSPITAL_COMMUNITY): Payer: Medicaid Other

## 2017-07-21 DIAGNOSIS — Z79899 Other long term (current) drug therapy: Secondary | ICD-10-CM | POA: Insufficient documentation

## 2017-07-21 DIAGNOSIS — M79672 Pain in left foot: Secondary | ICD-10-CM | POA: Insufficient documentation

## 2017-07-21 MED ORDER — ONDANSETRON 4 MG PO TBDP
4.0000 mg | ORAL_TABLET | Freq: Once | ORAL | Status: AC
  Administered 2017-07-21: 4 mg via ORAL
  Filled 2017-07-21: qty 1

## 2017-07-21 MED ORDER — IBUPROFEN 800 MG PO TABS
800.0000 mg | ORAL_TABLET | Freq: Once | ORAL | Status: AC
  Administered 2017-07-21: 800 mg via ORAL
  Filled 2017-07-21: qty 1

## 2017-07-21 MED ORDER — IBUPROFEN 800 MG PO TABS: 800.0000 mg | ORAL_TABLET | Freq: Three times a day (TID) | ORAL | 0 refills | Status: DC | PRN

## 2017-07-21 NOTE — ED Triage Notes (Signed)
L/foot pain since last night. Pt twisted foot while walking

## 2017-07-21 NOTE — Discharge Instructions (Signed)
Take ibuprofen 3 times a day with meals. Do not take other anti-inflammatories at the same time (Advil, Motrin, naproxen, Aleve). You may supplement with Tylenol as needed for further pain control. Try to rest, ice, and elevate your foot as much as possible in the next several days. Use the Ace wrap as needed for support and pain control. When icing you foot, ice it for 20 minutes at a time. Do this multiple times that the day. Follow-up with orthopedics in one week if your pain is not improving. Return to the emergency department if you develop numbness, tingling, or any new or worsening symptoms.

## 2017-07-21 NOTE — ED Provider Notes (Signed)
[facility] Provider Note   CSN: [id number] Arrival date & time: 07/21/17  1221  By signing my name below, I, Emily Horne, attest that this documentation has been prepared under the direction and in the presence of Emily Dress, PA-C. Electronically Signed: Diona Horne, ED Scribe. 07/21/17. 12:53 PM.  History   Chief Complaint Chief Complaint  Patient presents with  . Foot Pain    HPI Emily Horne is a 26 y.o. female who presents to the Emergency Department complaining of sudden left foot pain that occurred last night. Pt reports she was out last night drinking and remembers feeling pain, but doesn't remember what she did to injury her foot. Patient is also reporting a headache and some nausea, which she thinks is likely related to her alcohol intake. Patient reports pain with movement of her toes or ankle. Patient reports decreased pain while sitting and nonweightbearing. She has not taken anything for her pain. She reports pain with ambulation. Pt denies knee pain, injury elsewhere, numbness, tingling, or any other complaints at this time. She has not had any recent antibiotic or steroid use.  The history is provided by the patient. No language interpreter was used.    Past Medical History:  Diagnosis Date  . Allergy   . Herpes simplex without mention of complication     Patient Active Problem List   Diagnosis Date Noted  . Laceration of labia--1st degree, right, due to trauma 11/26/2013  . Well adult exam 05/16/2013  . 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 History of chlamydia infection 03/13/2013  . ACNE 12/25/2008  . ALLERGIC RHINITIS 03/25/2008    Past Surgical History:  Procedure Laterality Date  . NO PAST SURGERIES      OB History    Gravida Para Term Preterm AB Living   1 0 0   1 0   SAB TAB Ectopic Multiple Live Births   1               Home Medications    Prior to Admission medications   Medication Sig Start Date End Date Taking?  Authorizing Provider  amoxicillin (AMOXIL) 500 MG capsule Take 2 capsules (1,000 mg total) by mouth 2 (two) times daily. 10/14/14   Elpidio Anis, PA-C  erythromycin ophthalmic ointment Place a 1/2 inch ribbon of ointment into the lower eyelid of both eyes 4 x daily. 10/17/16   Roxy Horseman, PA-C  etonogestrel (NEXPLANON) 68 MG IMPL implant 68 mg by Subdermal route once.     [provider]  guaiFENesin (MUCINEX) 600 MG 12 hr tablet Take 600 mg by mouth 2 (two) times daily as needed for to loosen phlegm.    [provider]  ibuprofen (ADVIL,MOTRIN) 800 MG tablet Take 1 tablet (800 mg total) by mouth 3 (three) times daily with meals as needed. 07/21/17   Nikie Cid, PA-C  Ibuprofen-Diphenhydramine Cit (ADVIL PM) 200-38 MG TABS Take 2 tablets by mouth daily as needed (for pain).    [provider]  ondansetron (ZOFRAN ODT) 4 MG disintegrating tablet Take 1 tablet (4 mg total) by mouth every 6 (six) hours as needed for nausea or vomiting. 12/12/16   Jacalyn Lefevre, MD  Pseudoephedrine-DM-GG-APAP (TYLENOL COLD) 30-15-200-325 MG TABS Take 2 tablets by mouth daily as needed (for cold).    [provider]    Family History Family History  Problem Relation Age of Onset  . Diabetes Father   . Asthma Father   . Migraines Mother  Social History Social History  Substance Use Topics  . Smoking status: Never Smoker  . Smokeless tobacco: Never Used  . Alcohol use Yes     Comment: socialy     Allergies   Bactrim [sulfamethoxazole-trimethoprim]   Review of Systems Review of Systems  Gastrointestinal: Positive for nausea.  Musculoskeletal: Positive for arthralgias and joint swelling.  Skin: Negative for wound.  Neurological: Positive for headaches. Negative for numbness.  Hematological: Does not bruise/bleed easily.     Physical Exam Updated Vital Signs BP (!) 125/95 (BP Location: Right Arm)   Pulse 79   Resp 16   Wt 74.8 kg (165 lb)   LMP  05/21/2017 (Approximate) Comment: implant  SpO2 99%   BMI 26.63 kg/m   Physical Exam  Constitutional: She is oriented to person, place, and time. She appears well-developed and well-nourished. No distress.  HENT:  Head: Normocephalic and atraumatic.  Eyes: EOM are normal.  Neck: Normal range of motion.  Pulmonary/Chest: Effort normal.  Abdominal: She exhibits no distension.  Musculoskeletal: Normal range of motion.  No obvious swelling of the left foot or ankle. No obvious contusions, lacerations, or signs of injury. Patient with full active range of motion of the ankle and toes with minimal pain. Tenderness to palpation of the dorsal lateral aspect of the foot over the fourth and fifth tarsals. No tenderness to palpation of the MTP joints, calcaneus, or ankle. Sensation intact. Pedal pulses equal bilaterally. Color and warmth equal bilaterally. Compartments soft.  Neurological: She is alert and oriented to person, place, and time. No sensory deficit. GCS eye subscore is 4. GCS verbal subscore is 5. GCS motor subscore is 6.  Skin: Skin is warm and dry.  Psychiatric: She has a normal mood and affect.  Nursing note and vitals reviewed.    ED Treatments / Results  COORDINATION OF CARE: 12:53 PM-Discussed next steps with pt which includes ibuprofen for pain and an XR of her left foot. Pt verbalized understanding and is agreeable with the plan.   Labs (all labs ordered are listed, but only abnormal results are displayed) Labs Reviewed - No data to display  EKG  EKG Interpretation None       Radiology Dg Foot Complete Left  Result Date: 07/21/2017 CLINICAL DATA:  Pain and involving the dorsum of the left foot. No known injury. EXAM: LEFT FOOT - COMPLETE 3+ VIEW COMPARISON:  Left ankle radiographs -09/21/2006 FINDINGS: No acute fracture or dislocation. There is a well corticated ossicle adjacent to the medial malleolus which likely represents the sequela of prior avulsive injury. Pes  planus deformity suspected on this nonweightbearing radiograph. Joint spaces are preserved. No significant hallux valgus deformity. No plantar calcaneal spur. Regional soft tissues appear normal. No radiopaque foreign body. IMPRESSION: 1. No acute fracture or radiopaque foreign body. 2. Ossicle adjacent to the medial malleolus likely represents the sequela of prior avulsive injury. Electronically Signed   By: Simonne Come M.D.   On: 07/21/2017 14:00    Procedures Procedures (including critical care time)  Medications Ordered in ED Medications  ibuprofen (ADVIL,MOTRIN) tablet 800 mg (800 mg Oral Given 07/21/17 1355)  ondansetron (ZOFRAN-ODT) disintegrating tablet 4 mg (4 mg Oral Given 07/21/17 1346)     Initial Impression / Assessment and Plan / ED Course  I have reviewed the triage vital signs and the nursing notes.  Pertinent labs & imaging results that were available during my care of the patient were reviewed by me and considered in my medical  decision making (see chart for details).     Patient with left dorsal foot pain after unknown injury last night. Physical exam shows minimal tenderness to palpation, but no obvious hematoma, laceration, or swelling. X-ray negative for fracture or dislocation. Likely muscular or ligamentous pain. Patient neurovascularly intact with soft compartments. Additionally, patient reporting headache and nausea. Likely due to excessive alcohol intake last night. Patient did not want workup for these symptoms, just for her foot. Patient improved with Zofran and ibuprofen.  Discussed findings with patient. Will discharge home with conservative measures. At this time, patient appears safe for discharge. Return precautions given. Patient states she understands and agrees to plan.  Final Clinical Impressions(s) / ED Diagnoses   Final diagnoses:  Left foot pain    New Prescriptions Discharge Medication List as of 07/21/2017  3:21 PM    START taking these  medications   Details  ibuprofen (ADVIL,MOTRIN) 800 MG tablet Take 1 tablet (800 mg total) by mouth 3 (three) times daily with meals as needed., Starting Sat 07/21/2017, Print       I personally performed the services described in this documentation, which was scribed in my presence. The recorded information has been reviewed and is accurate.     Alveria ApleyCaccavale, Detrice Cales, PA-C 07/21/17 1651    Mabe, Latanya MaudlinMartha L, MD 07/22/17 0700

## 2017-11-08 ENCOUNTER — Other Ambulatory Visit: Payer: Self-pay

## 2017-11-08 ENCOUNTER — Encounter (HOSPITAL_COMMUNITY): Payer: Self-pay | Admitting: Emergency Medicine

## 2017-11-08 ENCOUNTER — Ambulatory Visit (HOSPITAL_COMMUNITY)
Admission: EM | Admit: 2017-11-08 | Discharge: 2017-11-08 | Disposition: A | Discharge status: Home / Self Care | Payer: Self-pay | Attending: Emergency Medicine | Admitting: Emergency Medicine

## 2017-11-08 DIAGNOSIS — J020 Streptococcal pharyngitis: Secondary | ICD-10-CM

## 2017-11-08 DIAGNOSIS — J029 Acute pharyngitis, unspecified: Secondary | ICD-10-CM

## 2017-11-08 LAB — POCT RAPID STREP A: Streptococcus, Group A Screen (Direct): POSITIVE — AB

## 2017-11-08 MED ORDER — PENICILLIN G BENZATHINE 1200000 UNIT/2ML IM SUSP
1.2000 10*6.[IU] | Freq: Once | INTRAMUSCULAR | Status: AC
  Administered 2017-11-08: 1.2 10*6.[IU] via INTRAMUSCULAR

## 2017-11-08 MED ORDER — PENICILLIN G BENZATHINE 1200000 UNIT/2ML IM SUSP
INTRAMUSCULAR | Status: AC
  Filled 2017-11-08: qty 2

## 2017-11-08 NOTE — Discharge Instructions (Signed)
Push fluids to ensure adequate hydration and keep secretions thin.  °Tylenol and/or ibuprofen as needed for pain or fevers.  °If symptoms worsen or do not improve in the next week to return to be seen or to follow up with PCP.   ° °

## 2017-11-08 NOTE — ED Provider Notes (Signed)
[facility]    CSN: [id number] Arrival date & time: 11/08/17  1527     History   Chief Complaint Chief Complaint  Patient presents with  . Sore Throat    HPI Emily Horne is a 26 y.o. female.   Emily Horne presents with complaints of sore throat which started this morning. She states she has had strep in the past and this feels similar. The past few days she has had sneezing and runny nose. Denies cough, ear pain or fevers. Denies headache, rash, gi/gu complaints. Pain 9/10 if swallow. Decrease intake due to pain. No known ill contacts. Took cold and flu medication today which did not help.   ROS per HPI.       Past Medical History:  Diagnosis Date  . Allergy   . Herpes simplex without mention of complication     Patient Active Problem List   Diagnosis Date Noted  . Laceration of labia--1st degree, right, due to trauma 11/26/2013  . Well adult exam 05/16/2013  . 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 History of chlamydia infection 03/13/2013  . ACNE 12/25/2008  . ALLERGIC RHINITIS 03/25/2008    Past Surgical History:  Procedure Laterality Date  . NO PAST SURGERIES      OB History    Gravida Para Term Preterm AB Living   1 0 0   1 0   SAB TAB Ectopic Multiple Live Births   1               Home Medications    Prior to Admission medications   Medication Sig Start Date End Date Taking? Authorizing Provider  etonogestrel (NEXPLANON) 68 MG IMPL implant 68 mg by Subdermal route once.     [provider]    Family History Family History  Problem Relation Age of Onset  . Diabetes Father   . Asthma Father   . Migraines Mother     Social History Social History   Tobacco Use  . Smoking status: Never Smoker  . Smokeless tobacco: Never Used  Substance Use Topics  . Alcohol use: Yes    Comment: socialy  . Drug use: Yes    Types: Marijuana     Allergies   Bactrim [sulfamethoxazole-trimethoprim]   Review of Systems Review of  Systems   Physical Exam Triage Vital Signs ED Triage Vitals  Enc Vitals Group     BP 11/08/17 1602 132/90     Pulse Rate 11/08/17 1602 94     Resp 11/08/17 1602 18     Temp 11/08/17 1602 98.6 F (37 C)     Temp Source 11/08/17 1602 Oral     SpO2 11/08/17 1602 98 %     Weight --      Height --      Head Circumference --      Peak Flow --      Pain Score 11/08/17 1559 8     Pain Loc --      Pain Edu? --      Excl. in GC? --    No data found.  Updated Vital Signs BP 132/90 (BP Location: Right Arm)   Pulse 94   Temp 98.6 F (37 C) (Oral)   Resp 18   SpO2 98%   Visual Acuity Right Eye Distance:   Left Eye Distance:   Bilateral Distance:    Right Eye Near:   Left Eye Near:    Bilateral Near:     Physical Exam  Constitutional: She is oriented to person, place, and time. She appears well-developed and well-nourished. No distress.  HENT:  Head: Normocephalic and atraumatic.  Right Ear: Tympanic membrane, external ear and ear canal normal.  Left Ear: Tympanic membrane, external ear and ear canal normal.  Nose: Nose normal.  Mouth/Throat: Uvula is midline, oropharynx is clear and moist and mucous membranes are normal. Tonsils are 2+ on the right. Tonsils are 2+ on the left. Tonsillar exudate.  Eyes: Conjunctivae and EOM are normal. Pupils are equal, round, and reactive to light.  Cardiovascular: Normal rate, regular rhythm and normal heart sounds.  Pulmonary/Chest: Effort normal and breath sounds normal.  Neurological: She is alert and oriented to person, place, and time.  Skin: Skin is warm and dry.     UC Treatments / Results  Labs (all labs ordered are listed, but only abnormal results are displayed) Labs Reviewed  POCT RAPID STREP A - Abnormal; Notable for the following components:      Result Value   Streptococcus, Group A Screen (Direct) POSITIVE (*)    All other components within normal limits    EKG  EKG Interpretation None       Radiology No  results found.  Procedures Procedures (including critical care time)  Medications Ordered in UC Medications  penicillin g benzathine (BICILLIN LA) 1200000 UNIT/2ML injection 1.2 Million Units (not administered)     Initial Impression / Assessment and Plan / UC Course  I have reviewed the triage vital signs and the nursing notes.  Pertinent labs & imaging results that were available during my care of the patient were reviewed by me and considered in my medical decision making (see chart for details).     Bicillin given per patient request for positive strep, consistent with physical exam. Push fluids to ensure adequate hydration and keep secretions thin. Tylenol and/or ibuprofen as needed for pain or fevers. If symptoms worsen or do not improve in the next week to return to be seen or to follow up with PCP. Patient verbalized understanding and agreeable to plan.    Final Clinical Impressions(s) / UC Diagnoses   Final diagnoses:  Strep pharyngitis    ED Discharge Orders    None       Controlled Substance Prescriptions [place] Controlled Substance Registry consulted? Not Applicable   Georgetta HaberBurky, Tekeshia Klahr B, NP 11/08/17 1630

## 2017-11-08 NOTE — ED Triage Notes (Addendum)
Patient woke to a sore throat today.  Unknown if patient has had a fever.  Patient is already asking for a shot.    Initially said felt fine yesterday.  Then said she had a runny nose and sneezing yesterday

## 2018-06-13 ENCOUNTER — Inpatient Hospital Stay (HOSPITAL_COMMUNITY)
Admission: AD | Admit: 2018-06-13 | Discharge: 2018-06-13 | Disposition: A | Discharge status: Home / Self Care | Payer: Self-pay | Source: Ambulatory Visit | Attending: Obstetrics and Gynecology | Admitting: Obstetrics and Gynecology

## 2018-06-13 ENCOUNTER — Encounter (HOSPITAL_COMMUNITY): Payer: Self-pay

## 2018-06-13 DIAGNOSIS — Z978 Presence of other specified devices: Secondary | ICD-10-CM

## 2018-06-13 DIAGNOSIS — N939 Abnormal uterine and vaginal bleeding, unspecified: Secondary | ICD-10-CM | POA: Insufficient documentation

## 2018-06-13 DIAGNOSIS — Z882 Allergy status to sulfonamides status: Secondary | ICD-10-CM | POA: Insufficient documentation

## 2018-06-13 DIAGNOSIS — Z79899 Other long term (current) drug therapy: Secondary | ICD-10-CM | POA: Insufficient documentation

## 2018-06-13 DIAGNOSIS — Z975 Presence of (intrauterine) contraceptive device: Secondary | ICD-10-CM | POA: Insufficient documentation

## 2018-06-13 DIAGNOSIS — N921 Excessive and frequent menstruation with irregular cycle: Secondary | ICD-10-CM

## 2018-06-13 HISTORY — DX: Chlamydial infection, unspecified: A74.9

## 2018-06-13 HISTORY — DX: Urinary tract infection, site not specified: N39.0

## 2018-06-13 LAB — CBC
HCT: 38.1 % (ref 36.0–46.0)
HEMOGLOBIN: 12.5 g/dL (ref 12.0–15.0)
MCH: 28.1 pg (ref 26.0–34.0)
MCHC: 32.8 g/dL (ref 30.0–36.0)
MCV: 85.6 fL (ref 78.0–100.0)
Platelets: 400 10*3/uL (ref 150–400)
RBC: 4.45 MIL/uL (ref 3.87–5.11)
RDW: 14 % (ref 11.5–15.5)
WBC: 10.4 10*3/uL (ref 4.0–10.5)

## 2018-06-13 LAB — URINALYSIS, ROUTINE W REFLEX MICROSCOPIC
BACTERIA UA: NONE SEEN
Bilirubin Urine: NEGATIVE
Glucose, UA: NEGATIVE mg/dL
KETONES UR: NEGATIVE mg/dL
Leukocytes, UA: NEGATIVE
Nitrite: NEGATIVE
Protein, ur: NEGATIVE mg/dL
Specific Gravity, Urine: 1.017 (ref 1.005–1.030)
pH: 5 (ref 5.0–8.0)

## 2018-06-13 LAB — POCT PREGNANCY, URINE: PREG TEST UR: NEGATIVE

## 2018-06-13 MED ORDER — NORGESTIMATE-ETH ESTRADIOL 0.25-35 MG-MCG PO TABS: 1.0000 | ORAL_TABLET | Freq: Every day | ORAL | 1 refills | Status: DC

## 2018-06-13 NOTE — Discharge Instructions (Signed)
Abnormal Uterine Bleeding Abnormal uterine bleeding means bleeding more than usual from your uterus. It can include:  Bleeding between periods.  Bleeding after sex.  Bleeding that is heavier than normal.  Periods that last longer than usual.  Bleeding after you have stopped having your period (menopause).  There are many problems that may cause this. You should see a doctor for any kind of bleeding that is not normal. Treatment depends on the cause of the bleeding. Follow these instructions at home:  Watch your condition for any changes.  Do not use tampons, douche, or have sex, if your doctor tells you not to.  Change your pads often.  Get regular well-woman exams. Make sure they include a pelvic exam and cervical cancer screening.  Keep all follow-up visits as told by your doctor. This is important. Contact a doctor if:  The bleeding lasts more than one week.  You feel dizzy at times.  You feel like you are going to throw up (nauseous).  You throw up. Get help right away if:  You pass out.  You have to change pads every hour.  You have belly (abdominal) pain.  You have a fever.  You get sweaty.  You get weak.  You passing large blood clots from your vagina. Summary  Abnormal uterine bleeding means bleeding more than usual from your uterus.  There are many problems that may cause this. You should see a doctor for any kind of bleeding that is not normal.  Treatment depends on the cause of the bleeding. This information is not intended to replace advice given to you by your health care provider. Make sure you discuss any questions you have with your health care provider. Document Released: 09/24/2009 Document Revised: 11/21/2016 Document Reviewed: 11/21/2016 Elsevier Interactive Patient Education  2017 [employer].   Preparing for Pregnancy If you are considering becoming pregnant, make an appointment to see your regular health care provider to learn how  to prepare for a safe and healthy pregnancy (preconception care). During a preconception care visit, your health care provider will:  Do a complete physical exam, including a Pap test.  Take a complete medical history.  Give you information, answer your questions, and help you resolve problems.  Preconception checklist Medical history  Tell your health care provider about any current or past medical conditions. Your pregnancy or your ability to become pregnant may be affected by chronic conditions, such as diabetes, chronic hypertension, and thyroid problems.  Include your family's medical history as well as your partner's medical history.  Tell your health care provider about any history of STIs (sexually transmitted infections).These can affect your pregnancy. In some cases, they can be passed to your baby. Discuss any concerns that you have about STIs.  If indicated, discuss the benefits of genetic testing. This testing will show whether there are any genetic conditions that may be passed from you or your partner to your baby.  Tell your health care provider about: ? Any problems you have had with conception or pregnancy. ? Any medicines you take. These include vitamins, herbal supplements, and over-the-counter medicines. ? Your history of immunizations. Discuss any vaccinations that you may need.  Diet  Ask your health care provider what to include in a healthy diet that has a balance of nutrients. This is especially important when you are pregnant or preparing to become pregnant.  Ask your health care provider to help you reach a healthy weight before pregnancy. ? If you are overweight,  you may be at higher risk for certain complications, such as high blood pressure, diabetes, and preterm birth. ? If you are underweight, you are more likely to have a baby who has a low birth weight.  Lifestyle, work, and home  Let your health care provider know: ? About any lifestyle habits  that you have, such as alcohol use, drug use, or smoking. ? About recreational activities that may put you at risk during pregnancy, such as downhill skiing and certain exercise programs. ? Tell your health care provider about any international travel, especially any travel to places with an active [place] virus outbreak. ? About harmful substances that you may be exposed to at work or at home. These include chemicals, pesticides, radiation, or even litter boxes. ? If you do not feel safe at home.  Mental health  Tell your health care provider about: ? Any history of mental health conditions, including feelings of depression, sadness, or anxiety. ? Any medicines that you take for a mental health condition. These include herbs and supplements.  Home instructions to prepare for pregnancy Lifestyle  Eat a balanced diet. This includes fresh fruits and vegetables, whole grains, lean meats, low-fat dairy products, healthy fats, and foods that are high in fiber. Ask to meet with a nutritionist or registered dietitian for assistance with meal planning and goals.  Get regular exercise. Try to be active for at least 30 minutes a day on most days of the week. Ask your health care provider which activities are safe during pregnancy.  Do not use any products that contain nicotine or tobacco, such as cigarettes and e-cigarettes. If you need help quitting, ask your health care provider.  Do not drink alcohol.  Do not take illegal drugs.  Maintain a healthy weight. Ask your health care provider what weight range is right for you.  General instructions  Keep an accurate record of your menstrual periods. This makes it easier for your health care provider to determine your baby's due date.  Begin taking prenatal vitamins and folic acid supplements daily as directed by your health care provider.  Manage any chronic conditions, such as high blood pressure and diabetes, as told by your health care provider.  This is important.  How do I know that I am pregnant? You may be pregnant if you have been sexually active and you miss your period. Symptoms of early pregnancy include:  Mild cramping.  Very light vaginal bleeding (spotting).  Feeling unusually tired.  Nausea and vomiting (morning sickness).  If you have any of these symptoms and you suspect that you might be pregnant, you can take a home pregnancy test. These tests check for a hormone in your urine (human chorionic gonadotropin, or hCG). A woman's body begins to make this hormone during early pregnancy. These tests are very accurate. Wait until at least the first day after you miss your period to take one. If the test shows that you are pregnant (you get a positive result), call your health care provider to make an appointment for prenatal care. What should I do if I become pregnant?  Make an appointment with your health care provider as soon as you suspect you are pregnant.  Do not use any products that contain nicotine, such as cigarettes, chewing tobacco, and e-cigarettes. If you need help quitting, ask your health care provider.  Do not drink alcoholic beverages. Alcohol is related to a number of birth defects.  Avoid toxic odors and chemicals.  You may  continue to have sexual intercourse if it does not cause pain or other problems, such as vaginal bleeding. This information is not intended to replace advice given to you by your health care provider. Make sure you discuss any questions you have with your health care provider. Document Released: 11/09/2008 Document Revised: 07/25/2016 Document Reviewed: 06/18/2016 Elsevier Interactive Patient Education  [employer]2018 Elsevier Inc.

## 2018-06-13 NOTE — MAU Note (Addendum)
Pt has the nexplanon in for 3 years. Wants to get it out, was supposed to be out this year. Is having bleeding and comes and goes. Pain in her arm where the nexplanon is.   Pt states she's tired of the bleeding and wants it to stop because her birthday is in a few days.

## 2018-06-13 NOTE — MAU Provider Note (Signed)
, Chief Complaint: Vaginal Bleeding   First Provider Initiated Contact with Patient 06/13/18 1017     SUBJECTIVE HPI: Emily Horne is a 27 y.o. G1P0010 female who presents to Maternity Admissions reporting irregular vaginal bleeding that she attributes to having Nexplanon.  It is time for Nexplanon to come out, but she does not have insurance or Medicaid and cannot afford to have it taken out.  Bleeding is not heavy but she is annoyed by it and does not want to be bleeding on her birthday. Mild tenderness at Nexplanon site. No redness, warmth or swelling.   Associated signs and symptoms: neg for fever, chills, abd pain, dizziness.   Past Medical History:  Diagnosis Date  . Allergy   . Chlamydia   . Herpes simplex without mention of complication   . UTI (urinary tract infection)    OB History  Gravida Para Term Preterm AB Living  1 0 0   1 0  SAB TAB Ectopic Multiple Live Births  1            # Outcome Date GA Lbr Len/2nd Weight Sex Delivery Anes PTL Lv  1 SAB            Past Surgical History:  Procedure Laterality Date  . NO PAST SURGERIES     Social History   Socioeconomic History  . Marital status: Single    Spouse name: Not on file  . Number of children: Not on file  . Years of education: Not on file  . Highest education level: Not on file  Occupational History  . Not on file  Social Needs  . Financial resource strain: Not on file  . Food insecurity:    Worry: Not on file    Inability: Not on file  . Transportation needs:    Medical: Not on file    Non-medical: Not on file  Tobacco Use  . Smoking status: Never Smoker  . Smokeless tobacco: Never Used  Substance and Sexual Activity  . Alcohol use: Yes    Comment: socialy  . Drug use: Yes    Types: Marijuana  . Sexual activity: Yes    Birth control/protection: Implant  Lifestyle  . Physical activity:    Days per week: Not on file    Minutes per session: Not on file  . Stress: Not on file   Relationships  . Social connections:    Talks on phone: Not on file    Gets together: Not on file    Attends religious service: Not on file    Active member of club or organization: Not on file    Attends meetings of clubs or organizations: Not on file    Relationship status: Not on file  . Intimate partner violence:    Fear of current or ex partner: Not on file    Emotionally abused: Not on file    Physically abused: Not on file    Forced sexual activity: Not on file  Other Topics Concern  . Not on file  Social History Narrative  . Not on file   Family History  Problem Relation Age of Onset  . Diabetes Father   . Asthma Father   . Migraines Mother    No current facility-administered medications on file prior to encounter.    Current Outpatient Medications on File Prior to Encounter  Medication Sig Dispense Refill  . etonogestrel (NEXPLANON) 68 MG IMPL implant 68 mg by Subdermal route once.  Allergies  Allergen Reactions  . Bactrim [Sulfamethoxazole-Trimethoprim] Other (See Comments)    Cold sweats all night    I have reviewed patient's Past Medical Hx, Surgical Hx, Family Hx, Social Hx, medications and allergies.   Review of Systems  Constitutional: Negative for chills and fever.  Gastrointestinal: Negative for abdominal pain.  Genitourinary: Positive for vaginal bleeding. Negative for vaginal discharge and vaginal pain.  Neurological: Negative for dizziness.    OBJECTIVE Patient Vitals for the past 24 hrs:  BP Temp Temp src Pulse Resp SpO2 Height Weight  06/13/18 0901 125/87 98.9 F (37.2 C) Oral 80 16 99 % 5\' 6"  (1.676 m) 199 lb (90.3 kg)   Constitutional: Well-developed, well-nourished female in no acute distress.  Cardiovascular: normal rate Respiratory: normal rate and effort.  GI: Declined MS: Extremities nontender, no edema, normal ROM. No tenderness, swelling, erythema, drainage at Nexplanon site.  Neurologic: Alert and oriented x 4.  GU:  Declined  LAB RESULTS Results for orders placed or performed during the hospital encounter of 06/13/18 (from the past 24 hour(s))  CBC     Status: None   Collection Time: 06/13/18  9:28 AM  Result Value Ref Range   WBC 10.4 4.0 - 10.5 K/uL   RBC 4.45 3.87 - 5.11 MIL/uL   Hemoglobin 12.5 12.0 - 15.0 g/dL   HCT 16.1 09.6 - 04.5 %   MCV 85.6 78.0 - 100.0 fL   MCH 28.1 26.0 - 34.0 pg   MCHC 32.8 30.0 - 36.0 g/dL   RDW 40.9 81.1 - 91.4 %   Platelets 400 150 - 400 K/uL  Urinalysis, Routine w reflex microscopic     Status: Abnormal   Collection Time: 06/13/18  9:41 AM  Result Value Ref Range   Color, Urine YELLOW YELLOW   APPearance CLEAR CLEAR   Specific Gravity, Urine 1.017 1.005 - 1.030   pH 5.0 5.0 - 8.0   Glucose, UA NEGATIVE NEGATIVE mg/dL   Hgb urine dipstick MODERATE (A) NEGATIVE   Bilirubin Urine NEGATIVE NEGATIVE   Ketones, ur NEGATIVE NEGATIVE mg/dL   Protein, ur NEGATIVE NEGATIVE mg/dL   Nitrite NEGATIVE NEGATIVE   Leukocytes, UA NEGATIVE NEGATIVE   RBC / HPF 0-5 0 - 5 RBC/hpf   WBC, UA 0-5 0 - 5 WBC/hpf   Bacteria, UA NONE SEEN NONE SEEN   Squamous Epithelial / LPF 0-5 0 - 5   Mucus PRESENT   Pregnancy, urine POC     Status: None   Collection Time: 06/13/18  9:47 AM  Result Value Ref Range   Preg Test, Ur NEGATIVE NEGATIVE    IMAGING No results found.  MAU COURSE Orders Placed This Encounter  Procedures  . Urinalysis, Routine w reflex microscopic  . CBC  . Pregnancy, urine POC  . Discharge patient   Meds ordered this encounter  Medications  . norgestimate-ethinyl estradiol (ORTHO-CYCLEN,SPRINTEC,PREVIFEM) 0.25-35 MG-MCG tablet    Sig: Take 1 tablet by mouth daily. Take 2 pills a day x3 days then 1 pill/day.    Dispense:  1 Package    Refill:  1    Order Specific Question:   Supervising Provider    Answer:   CONSTANT, PEGGY [[id number]]    MDM - BTB on Nexplanon. Will tx w/ w/ 1 pack of Sprintec. [facility]erred to [facility] or [facility] for [facility]  removal.   ASSESSMENT 1. Breakthrough bleeding on Nexplanon     PLAN Discharge home in stable condition. Bleeding precautions Follow-up Information  [facility]. Schedule an appointment as soon as possible for a visit.   Why:  For Nexplanon removal and placement Contact information: [address] [place] [address] [phone]        [facility]   Why:  [phone]         Allergies as of 06/13/2018      Reactions   Bactrim [sulfamethoxazole-trimethoprim] Other (See Comments)   Cold sweats all night      Medication List    TAKE these medications   NEXPLANON 68 MG Impl implant Generic drug:  etonogestrel 68 mg by Subdermal route once.   norgestimate-ethinyl estradiol 0.25-35 MG-MCG tablet Commonly known as:  ORTHO-CYCLEN,SPRINTEC,PREVIFEM Take 1 tablet by mouth daily. Take 2 pills a day x3 days then 1 pill/day.        Katrinka Blazing, [place], CNM 06/13/2018  10:22 AM

## 2018-09-06 IMAGING — CR DG FOOT COMPLETE 3+V*L*
3 series · 3 of 3 positions shown · non-contrast
Comparison: Left ankle radiographs -09/21/2006

CLINICAL DATA: Pain and involving the dorsum of the left foot. No
known injury.

EXAM:
LEFT FOOT - COMPLETE 3+ VIEW

[x foot ap left]
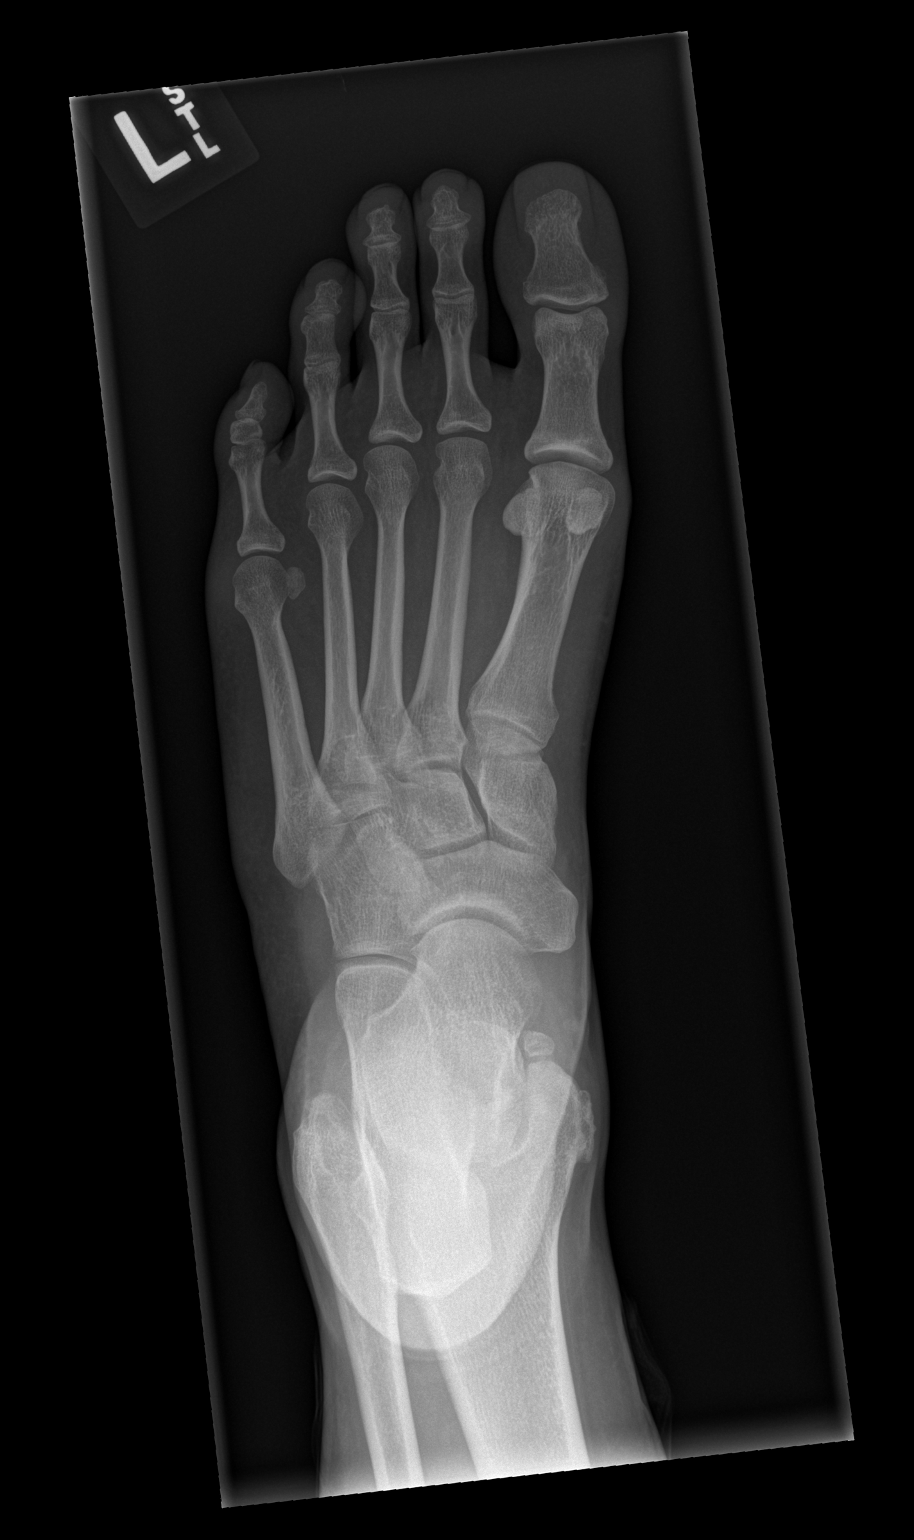

[x foot obl left]
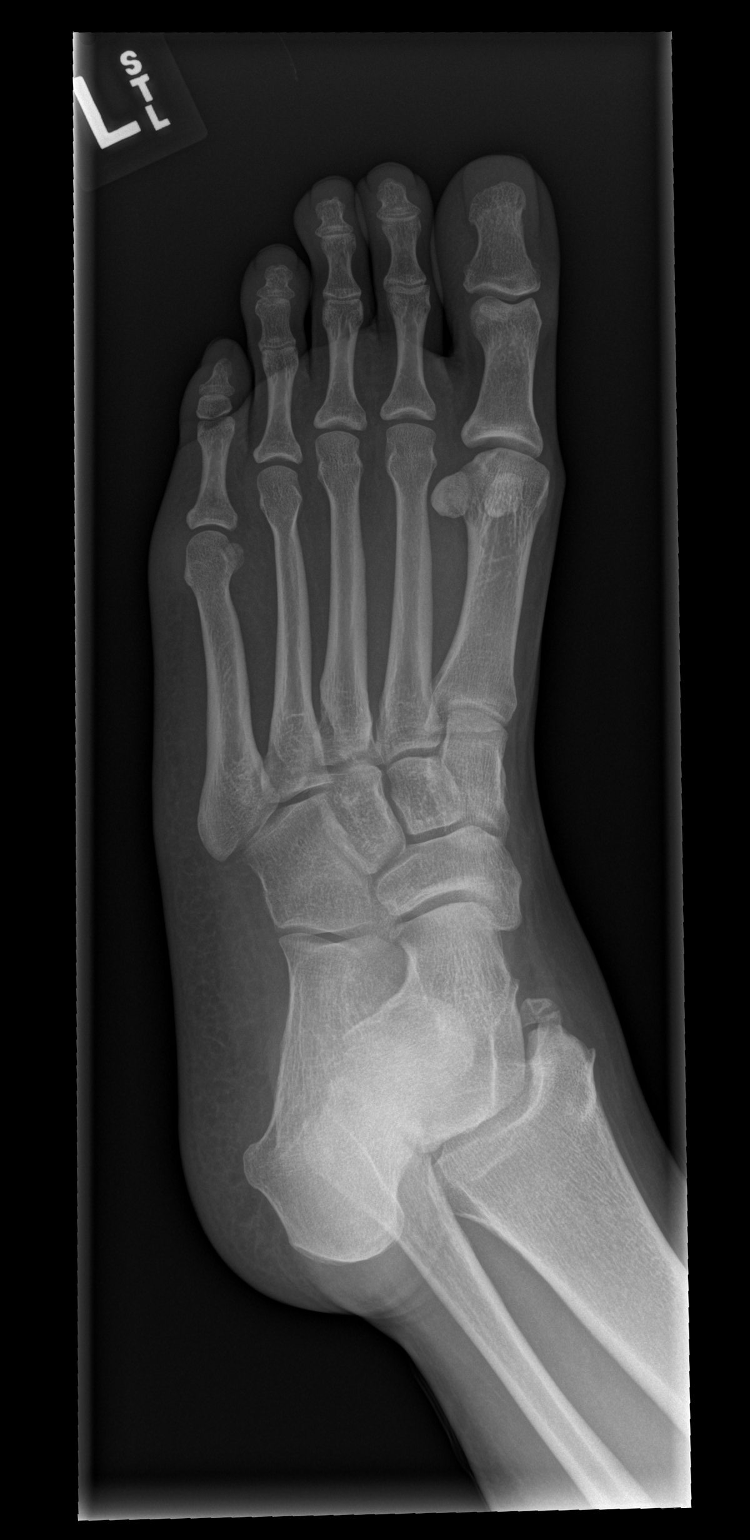

[x foot lat left]
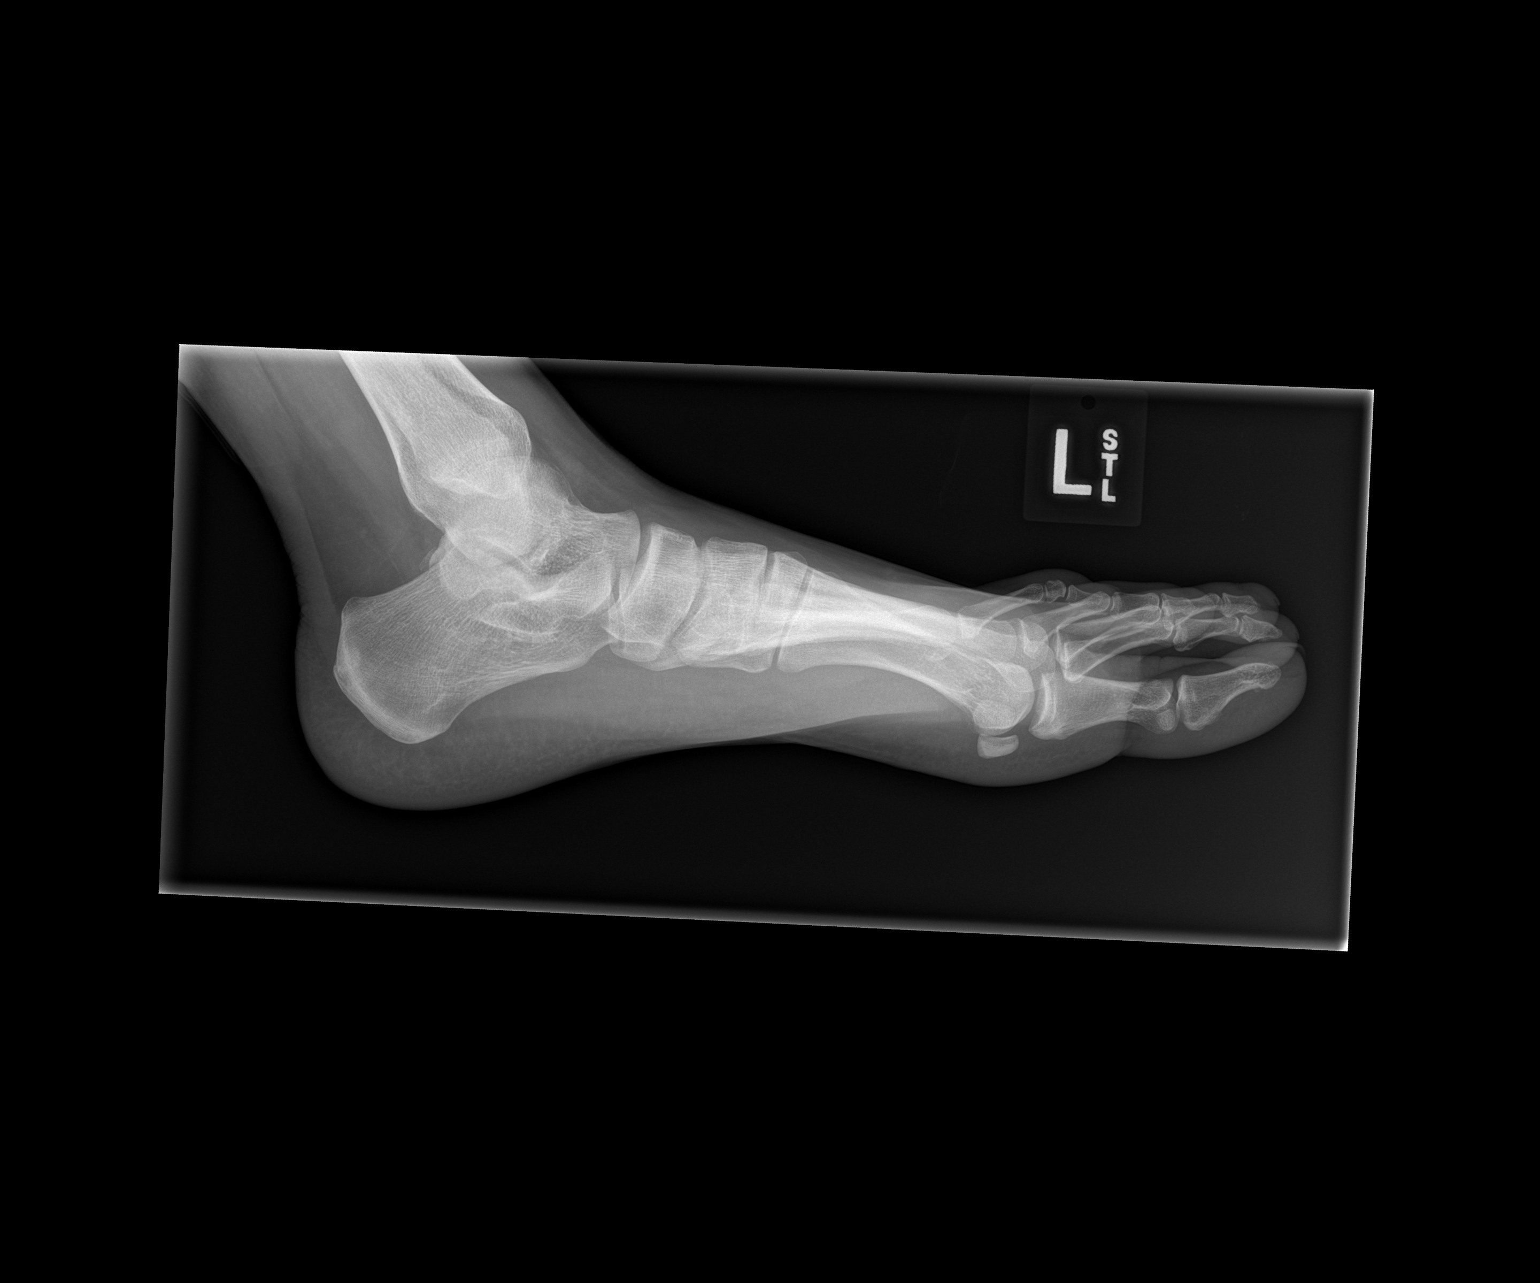

[3 of 3 positions shown; findings below may reference images not displayed]

FINDINGS: No acute fracture or dislocation. There is a well corticated ossicle
adjacent to the medial malleolus which likely represents the sequela
of prior avulsive injury. Pes planus deformity suspected on this
nonweightbearing radiograph. Joint spaces are preserved. No
significant hallux valgus deformity. No plantar calcaneal spur.
Regional soft tissues appear normal. No radiopaque foreign body.
IMPRESSION: 1. No acute fracture or radiopaque foreign body.
2. Ossicle adjacent to the medial malleolus likely represents the
sequela of prior avulsive injury.

## 2019-09-13 ENCOUNTER — Emergency Department (HOSPITAL_COMMUNITY): Payer: Self-pay

## 2019-09-13 ENCOUNTER — Other Ambulatory Visit: Payer: Self-pay

## 2019-09-13 ENCOUNTER — Emergency Department (HOSPITAL_COMMUNITY)
Admission: EM | Admit: 2019-09-13 | Discharge: 2019-09-13 | Disposition: A | Discharge status: Home / Self Care | Payer: Self-pay | Attending: Emergency Medicine | Admitting: Emergency Medicine

## 2019-09-13 ENCOUNTER — Encounter (HOSPITAL_COMMUNITY): Payer: Self-pay | Admitting: Emergency Medicine

## 2019-09-13 DIAGNOSIS — Y92000 Kitchen of unspecified non-institutional (private) residence as  the place of occurrence of the external cause: Secondary | ICD-10-CM | POA: Insufficient documentation

## 2019-09-13 DIAGNOSIS — W25XXXA Contact with sharp glass, initial encounter: Secondary | ICD-10-CM | POA: Insufficient documentation

## 2019-09-13 DIAGNOSIS — Z23 Encounter for immunization: Secondary | ICD-10-CM | POA: Insufficient documentation

## 2019-09-13 DIAGNOSIS — Z1881 Retained glass fragments: Secondary | ICD-10-CM | POA: Insufficient documentation

## 2019-09-13 DIAGNOSIS — Y9389 Activity, other specified: Secondary | ICD-10-CM | POA: Insufficient documentation

## 2019-09-13 DIAGNOSIS — Z79899 Other long term (current) drug therapy: Secondary | ICD-10-CM | POA: Insufficient documentation

## 2019-09-13 DIAGNOSIS — S90851A Superficial foreign body, right foot, initial encounter: Secondary | ICD-10-CM | POA: Insufficient documentation

## 2019-09-13 DIAGNOSIS — Y999 Unspecified external cause status: Secondary | ICD-10-CM | POA: Insufficient documentation

## 2019-09-13 MED ORDER — CEPHALEXIN 500 MG PO CAPS: 500.0000 mg | ORAL_CAPSULE | Freq: Two times a day (BID) | ORAL | 0 refills | Status: AC

## 2019-09-13 MED ORDER — TETANUS-DIPHTH-ACELL PERTUSSIS 5-2.5-18.5 LF-MCG/0.5 IM SUSP
0.5000 mL | Freq: Once | INTRAMUSCULAR | Status: AC
  Administered 2019-09-13: 0.5 mL via INTRAMUSCULAR
  Filled 2019-09-13: qty 0.5

## 2019-09-13 MED ORDER — LIDOCAINE HCL (PF) 1 % IJ SOLN
5.0000 mL | Freq: Once | INTRAMUSCULAR | Status: AC
  Administered 2019-09-13: 5 mL
  Filled 2019-09-13: qty 5

## 2019-09-13 NOTE — Discharge Instructions (Signed)
Please take antibiotics as prescribed - this is to prevent infection from the glass that was in your foot Return to the ED for any signs of infection including redness around the wound, swelling, drainage of what looks like pus, or if you develop fevers > 100.4 or chills  Follow up with your PCP. If you do not have one you may follow up with [facility] for your primary care needs You may take Ibuprofen 400 mg every 6 hours as needed for pain

## 2019-09-13 NOTE — ED Provider Notes (Signed)
[facility] EMERGENCY DEPARTMENT Provider Note   CSN: [id number] Arrival date & time: 09/13/19  1023     History   Chief Complaint Chief Complaint  Patient presents with  . Foot Pain    glass in foot    HPI Emily Horne is a 28 y.o. female who presents to the ED today complaining of glass in her right foot.  Reports that she accidentally dropped a piece of glass last night in the kitchen and stepped on a piece of it.  She attempted to get it out herself but could not prompting her to come to the ED today.  Tetanus not up-to-date. Bleeding controlled.  Denies fever, chills, drainage, any other associated symptoms.        Past Medical History:  Diagnosis Date  . Allergy   . Chlamydia   . Herpes simplex without mention of complication   . UTI (urinary tract infection)     Patient Active Problem List   Diagnosis Date Noted  . Laceration of labia--1st degree, right, due to trauma 11/26/2013  . Well adult exam 05/16/2013  . 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 History of chlamydia infection 03/13/2013  . ACNE 12/25/2008  . ALLERGIC RHINITIS 03/25/2008    Past Surgical History:  Procedure Laterality Date  . NO PAST SURGERIES       OB History    Gravida  1   Para  0   Term  0   Preterm      AB  1   Living  0     SAB  1   TAB      Ectopic      Multiple      Live Births               Home Medications    Prior to Admission medications   Medication Sig Start Date End Date Taking? Authorizing Provider  cephALEXin (KEFLEX) 500 MG capsule Take 1 capsule (500 mg total) by mouth 2 (two) times daily for 7 days. 09/13/19 09/20/19  Tanda Rockers, PA-C  etonogestrel (NEXPLANON) 68 MG IMPL implant 68 mg by Subdermal route once.     [provider]  norgestimate-ethinyl estradiol (ORTHO-CYCLEN,SPRINTEC,PREVIFEM) 0.25-35 MG-MCG tablet Take 1 tablet by mouth daily. Take 2 pills a day x3 days then 1 pill/day. 06/13/18   Dorathy Kinsman,  CNM    Family History Family History  Problem Relation Age of Onset  . Diabetes Father   . Asthma Father   . Migraines Mother     Social History Social History   Tobacco Use  . Smoking status: Never Smoker  . Smokeless tobacco: Never Used  Substance Use Topics  . Alcohol use: Yes    Comment: socialy  . Drug use: Yes    Types: Marijuana     Allergies   Bactrim [sulfamethoxazole-trimethoprim]   Review of Systems Review of Systems  Constitutional: Negative for chills and fever.  Skin: Positive for wound.     Physical Exam Updated Vital Signs BP (!) 131/109 (BP Location: Right Arm)   Pulse 88   Temp 98.8 F (37.1 C)   Resp 16   Ht 5\' 6"  (1.676 m)   Wt 81.6 kg   SpO2 98%   BMI 29.05 kg/m   Physical Exam Vitals signs and nursing note reviewed.  Constitutional:      Appearance: She is not ill-appearing.  HENT:     Head: Normocephalic and atraumatic.  Eyes:  Conjunctiva/sclera: Conjunctivae normal.  Cardiovascular:     Rate and Rhythm: Normal rate and regular rhythm.  Pulmonary:     Effort: Pulmonary effort is normal.     Breath sounds: Normal breath sounds.  Musculoskeletal:     Comments: Punctate wound noted to ball of right foot near distal 3rd metatarsal; bleeding controlled; mild TTP; no erythema, edema, or drainage noted. ROM intact throughout all toes and ankle. 2+ DP and PT pulse  Skin:    General: Skin is warm and dry.     Coloration: Skin is not jaundiced.  Neurological:     Mental Status: She is alert.      ED Treatments / Results  Labs (all labs ordered are listed, but only abnormal results are displayed) Labs Reviewed - No data to display  EKG None  Radiology Dg Foot Complete Right  Result Date: 09/13/2019 CLINICAL DATA:  Glass in foot. EXAM: RIGHT FOOT COMPLETE - 3+ VIEW COMPARISON:  None. FINDINGS: There is no acute displaced fracture or dislocation. There is a radiopaque foreign body in the plantar soft tissues of the foot  at the level of the proximal phalanx of the third digit. There is mild surrounding soft tissue swelling. IMPRESSION: Foreign body as detailed above. Electronically Signed   By: Katherine Mantlehristopher  Green M.D.   On: 09/13/2019 11:42    Procedures .Foreign Body Removal  Date/Time: 09/13/2019 12:15 PM Performed by: Tanda RockersVenter, Dolores Mcgovern, PA-C Authorized by: Tanda RockersVenter, Mindie Rawdon, PA-C  Consent: Verbal consent obtained. Consent given by: patient Body area: skin General location: lower extremity Location details: right foot Anesthesia: local infiltration  Anesthesia: Local Anesthetic: lidocaine 1% without epinephrine Anesthetic total: 5 mL Complexity: simple 1 objects recovered. Objects recovered: piece of glass Post-procedure assessment: foreign body removed Patient tolerance: patient tolerated the procedure well with no immediate complications   (including critical care time)  Medications Ordered in ED Medications  Tdap (BOOSTRIX) injection 0.5 mL (0.5 mLs Intramuscular Given 09/13/19 1145)  lidocaine (PF) (XYLOCAINE) 1 % injection 5 mL (5 mLs Infiltration Given 09/13/19 1147)     Initial Impression / Assessment and Plan / ED Course  I have reviewed the triage vital signs and the nursing notes.  Pertinent labs & imaging results that were available during my care of the patient were reviewed by me and considered in my medical decision making (see chart for details).    28 year old female who presents with foreign body in right foot.  On exam patient does have punctate wound.  Tetanus not up-to-date -updated in the ED today.  Will obtain x-ray prior to removal of foreign body.  X-ray with positive foreign body in the plantar aspect of the foot.  Wound cleaned and foreign body removed successfully.  Will prescribe antibiotics to cover prophylactically - pt reports she broke a glass a few days ago and only stepped onto it last night. Strict return precautions have been discussed with patient including  signs of infection.  She is advised to take ibuprofen and Tylenol as needed for pain.  She is in agreement with plan at this time stable for discharge home.  This note was prepared using Dragon voice recognition software and may include unintentional dictation errors due to the inherent limitations of voice recognition software.         Final Clinical Impressions(s) / ED Diagnoses   Final diagnoses:  Foreign body in right foot, initial encounter    ED Discharge Orders         Ordered  cephALEXin (KEFLEX) 500 MG capsule  2 times daily     09/13/19 1219           Eustaquio Maize, PA-C 09/13/19 1233    Wyvonnia Dusky, MD 09/13/19 [phone]

## 2019-09-13 NOTE — ED Triage Notes (Signed)
Pt. Stated, I stepped on a piece of glass last night. I tried to get it out but couldn't.

## 2019-09-13 NOTE — ED Notes (Signed)
Discharge instructions and prescription discussed with Pt. Pt verbalized understanding. Pt stable and ambulatory.    

## 2019-09-17 ENCOUNTER — Other Ambulatory Visit: Payer: Self-pay

## 2019-09-17 ENCOUNTER — Encounter (HOSPITAL_COMMUNITY): Payer: Self-pay

## 2019-09-17 ENCOUNTER — Ambulatory Visit (HOSPITAL_COMMUNITY)
Admission: EM | Admit: 2019-09-17 | Discharge: 2019-09-17 | Disposition: A | Discharge status: Home / Self Care | Payer: Medicaid Other | Attending: Family Medicine | Admitting: Family Medicine

## 2019-09-17 DIAGNOSIS — Z20828 Contact with and (suspected) exposure to other viral communicable diseases: Secondary | ICD-10-CM | POA: Insufficient documentation

## 2019-09-17 DIAGNOSIS — R030 Elevated blood-pressure reading, without diagnosis of hypertension: Secondary | ICD-10-CM | POA: Insufficient documentation

## 2019-09-17 DIAGNOSIS — J029 Acute pharyngitis, unspecified: Secondary | ICD-10-CM | POA: Insufficient documentation

## 2019-09-17 LAB — POCT RAPID STREP A: Streptococcus, Group A Screen (Direct): NEGATIVE

## 2019-09-17 LAB — POCT INFECTIOUS MONO SCREEN: Mono Screen: NEGATIVE

## 2019-09-17 NOTE — ED Provider Notes (Signed)
[facility]   [id number] 09/17/19 Arrival Time: 1125  ASSESSMENT & PLAN:  1. Sore throat   2. Elevated blood pressure reading without diagnosis of hypertension     No signs of peritonsillar abscess. Discussed. COVID-19 testing sent. To self-quarantine until results are available. Rapid strep negative. Culture sent. Monospot negative.    Discharge Instructions      You may use over the counter ibuprofen or acetaminophen as needed. Finish taking cephalexin until gone. This would treat strep throat.  For a sore throat, over the counter products such as [employer] Peroxyl Mouth Sore Rinse or Chloraseptic Sore Throat Spray may provide some temporary relief.  Your rapid strep test was negative today. We have sent your throat swab for culture and will let you know of any positive results. Also, your blood pressure was noted to be elevated during your visit today. You may return here within the next few days to recheck if unable to see your primary care doctor. If your blood pressure remains persistently elevated, you may need to begin taking a medication. BP (!) 138/101         Reviewed expectations re: course of current medical issues. Questions answered. Outlined signs and symptoms indicating need for more acute intervention. Patient verbalized understanding. After Visit Summary given.   SUBJECTIVE:  Emily Horne is a 28 y.o. female who reports a sore throat. Describes as pain with swallowing. Onset gradual beginning 3-4 days ago. Symptoms have progressed to a point and plateaued since beginning; without voice changes. No respiratory symptoms. Normal PO intake but reports discomfort with swallowing. No specific alleviating factors. Fever reported: Fever: absent. No neck pain or swelling. No associated nausea, vomiting, or abdominal pain. Known COVID exposure or sick contacts: none. Recent travel: none. OTC treatment: Tylenol with some help.  Increased blood  pressure noted today. Reports that she has not been treated for hypertension in the past.  She reports no chest pain on exertion, no dyspnea on exertion, no swelling of ankles, no orthostatic dizziness or lightheadedness, no orthopnea or paroxysmal nocturnal dyspnea, no palpitations and no intermittent claudication symptoms.  ROS: As per HPI. All other systems negative.    OBJECTIVE:  Vitals:   09/17/19 1154  BP: (!) 138/101  Pulse: 95  Resp: 19  Temp: 99 F (37.2 C)  TempSrc: Oral  SpO2: 98%     General appearance: alert; no distress HEENT: throat with mild erythema and cobblestoning; uvula is midline Neck: supple with FROM; no lymphadenopathy CV: RRR Lungs: clear to auscultation bilaterally Abd: soft; non-tender Skin: reveals no rash; warm and dry Neuro: normal gait Psychological: alert and cooperative; normal mood and affect  Allergies  Allergen Reactions  . Bactrim [Sulfamethoxazole-Trimethoprim] Other (See Comments)    Cold sweats all night  . Sulfamethoxazole-Trimethoprim Nausea Only    Past Medical History:  Diagnosis Date  . Allergy   . Chlamydia   . Herpes simplex without mention of complication   . UTI (urinary tract infection)    Social History   Socioeconomic History  . Marital status: Single    Spouse name: Not on file  . Number of children: Not on file  . Years of education: Not on file  . Highest education level: Not on file  Occupational History  . Not on file  Social Needs  . Financial resource strain: Not on file  . Food insecurity    Worry: Not on file    Inability: Not on file  . Transportation needs  Medical: Not on file    Non-medical: Not on file  Tobacco Use  . Smoking status: Never Smoker  . Smokeless tobacco: Never Used  Substance and Sexual Activity  . Alcohol use: Yes    Comment: socialy  . Drug use: Yes    Types: Marijuana  . Sexual activity: Yes    Birth control/protection: Implant  Lifestyle  . Physical activity     Days per week: Not on file    Minutes per session: Not on file  . Stress: Not on file  Relationships  . Social Herbalist on phone: Not on file    Gets together: Not on file    Attends religious service: Not on file    Active member of club or organization: Not on file    Attends meetings of clubs or organizations: Not on file    Relationship status: Not on file  . Intimate partner violence    Fear of current or ex partner: Not on file    Emotionally abused: Not on file    Physically abused: Not on file    Forced sexual activity: Not on file  Other Topics Concern  . Not on file  Social History Narrative  . Not on file   Family History  Problem Relation Age of Onset  . Diabetes Father   . Asthma Father   . Migraines Mother           Vanessa Kick, MD 09/20/19 [phone]

## 2019-09-17 NOTE — Discharge Instructions (Addendum)
You may use over the counter ibuprofen or acetaminophen as needed. Finish taking cephalexin until gone. This would treat strep throat. For a sore throat, over the counter products such as [employer] Peroxyl Mouth Sore Rinse or Chloraseptic Sore Throat Spray may provide some temporary relief. Your rapid strep test was negative today. We have sent your throat swab for culture and will let you know of any positive results. Also, your blood pressure was noted to be elevated during your visit today. You may return here within the next few days to recheck if unable to see your primary care doctor. If your blood pressure remains persistently elevated, you may need to begin taking a medication. BP (!) 138/101

## 2019-09-17 NOTE — ED Triage Notes (Signed)
Pt. States she has had a sore throat since Saturday. Pt. Wants a strep test and is being tested for COVID-19.

## 2019-09-19 LAB — NOVEL CORONAVIRUS, NAA (HOSP ORDER, SEND-OUT TO REF LAB; TAT 18-24 HRS): SARS-CoV-2, NAA: NOT DETECTED

## 2019-09-20 LAB — CULTURE, GROUP A STREP (THRC)

## 2019-09-22 ENCOUNTER — Telehealth (HOSPITAL_COMMUNITY): Payer: Self-pay | Admitting: Emergency Medicine

## 2019-09-22 NOTE — Telephone Encounter (Signed)
Culture is positive for non group A Strep germ.  This is a finding of uncertain significance; not the typical 'strep throat' germ. Attempted to reach patient to see how she was feeling. No answer at this time.   

## 2019-12-09 ENCOUNTER — Other Ambulatory Visit: Payer: Medicaid Other

## 2020-06-16 DIAGNOSIS — Z20822 Contact with and (suspected) exposure to covid-19: Secondary | ICD-10-CM | POA: Diagnosis not present

## 2020-06-16 DIAGNOSIS — Z03818 Encounter for observation for suspected exposure to other biological agents ruled out: Secondary | ICD-10-CM | POA: Diagnosis not present

## 2020-06-23 ENCOUNTER — Ambulatory Visit: Payer: Medicaid Other | Admitting: Internal Medicine

## 2020-06-23 DIAGNOSIS — Z0289 Encounter for other administrative examinations: Secondary | ICD-10-CM

## 2020-08-08 DIAGNOSIS — J029 Acute pharyngitis, unspecified: Secondary | ICD-10-CM | POA: Diagnosis not present

## 2020-08-08 DIAGNOSIS — R509 Fever, unspecified: Secondary | ICD-10-CM | POA: Diagnosis not present

## 2020-08-08 DIAGNOSIS — Z03818 Encounter for observation for suspected exposure to other biological agents ruled out: Secondary | ICD-10-CM | POA: Diagnosis not present

## 2020-08-08 DIAGNOSIS — J069 Acute upper respiratory infection, unspecified: Secondary | ICD-10-CM | POA: Diagnosis not present

## 2020-08-11 ENCOUNTER — Other Ambulatory Visit: Payer: Self-pay

## 2020-08-11 ENCOUNTER — Ambulatory Visit (HOSPITAL_COMMUNITY)
Admission: EM | Admit: 2020-08-11 | Discharge: 2020-08-11 | Disposition: A | Discharge status: Home / Self Care | Payer: BC Managed Care – PPO | Attending: Emergency Medicine | Admitting: Emergency Medicine

## 2020-08-11 ENCOUNTER — Encounter (HOSPITAL_COMMUNITY): Payer: Self-pay

## 2020-08-11 DIAGNOSIS — R519 Headache, unspecified: Secondary | ICD-10-CM | POA: Insufficient documentation

## 2020-08-11 DIAGNOSIS — J069 Acute upper respiratory infection, unspecified: Secondary | ICD-10-CM

## 2020-08-11 DIAGNOSIS — Z79899 Other long term (current) drug therapy: Secondary | ICD-10-CM | POA: Insufficient documentation

## 2020-08-11 DIAGNOSIS — R0602 Shortness of breath: Secondary | ICD-10-CM | POA: Diagnosis not present

## 2020-08-11 DIAGNOSIS — Z20822 Contact with and (suspected) exposure to covid-19: Secondary | ICD-10-CM | POA: Diagnosis not present

## 2020-08-11 DIAGNOSIS — R05 Cough: Secondary | ICD-10-CM | POA: Diagnosis not present

## 2020-08-11 LAB — SARS CORONAVIRUS 2 (TAT 6-24 HRS): SARS Coronavirus 2: NEGATIVE

## 2020-08-11 MED ORDER — IPRATROPIUM BROMIDE 0.06 % NA SOLN: 2.0000 | Freq: Four times a day (QID) | NASAL | 0 refills | Status: DC | PRN

## 2020-08-11 MED ORDER — FLUTICASONE PROPIONATE 50 MCG/ACT NA SUSP: 1.0000 | Freq: Every day | NASAL | 2 refills | Status: DC

## 2020-08-11 NOTE — ED Provider Notes (Signed)
[facility]    CSN: [id number] Arrival date & time: 08/11/20  0810      History   Chief Complaint Chief Complaint  Patient presents with   Sinus Issues    HPI Emily Horne is a 29 y.o. female.   Emily Horne presents with complaints of sinus symptoms. Head pressure. Ear pressure. Sore thoat. Nasal congestion. Symptoms started 8/29. She tested negative at other facilities on 8/26 and 8/29. These were send out presumably PCR tests. Felt she had a fever the first two days of symptoms, without any further fever. Some diarrhea which has improved. Decreased appetite. No loss of taste or smell.  There was covid at her work. Slight cough from post nasal drip. Has taken theraflu, allergy medication as well as tylenol, and advil pm. These have helped some. Shortness of breath at night due to nasal congestion. No other chest pain or shortness of breath.    ROS per HPI, negative if not otherwise mentioned.      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 Authorizing Provider  etonogestrel (NEXPLANON) 68 MG IMPL implant 68 mg by Subdermal route once.     [provider]  fluticasone (FLONASE) 50 MCG/ACT nasal spray  Place 1 spray into both nostrils daily. 08/11/20   Linus Mako B, NP  ipratropium (ATROVENT) 0.06 % nasal spray Place 2 sprays into both nostrils every 6 (six) hours as needed for rhinitis. 08/11/20   Georgetta Haber, NP  norgestimate-ethinyl estradiol (ORTHO-CYCLEN,SPRINTEC,PREVIFEM) 0.25-35 MG-MCG tablet Take 1 tablet by mouth daily. Take 2 pills a day x3 days then 1 pill/day. 06/13/18   Dorathy Kinsman, CNM    Family History Family History  Problem Relation Age of Onset   Diabetes Father    Asthma Father    Migraines Mother     Social History Social History   Tobacco Use   Smoking status: Never Smoker   Smokeless tobacco: Never Used  Substance Use Topics   Alcohol use: Yes    Comment: socialy   Drug use: Yes    Types: Marijuana     Allergies   Bactrim [sulfamethoxazole-trimethoprim] and Sulfamethoxazole-trimethoprim   Review of Systems Review of Systems   Physical Exam Triage Vital Signs ED Triage Vitals  Enc Vitals Group     BP 08/11/20 0838 (!) 145/119     Pulse Rate 08/11/20 0838 98  Resp 08/11/20 0838 18     Temp 08/11/20 0838 98.9 F (37.2 C)     Temp Source 08/11/20 0838 Oral     SpO2 08/11/20 0838 98 %     Weight --      Height --      Head Circumference --      Peak Flow --      Pain Score 08/11/20 0836 7     Pain Loc --      Pain Edu? --      Excl. in GC? --    No data found.  Updated Vital Signs BP (!) 145/119 (BP Location: Right Arm)    Pulse 98    Temp 98.9 F (37.2 C) (Oral)    Resp 18    SpO2 98%   Visual Acuity Right Eye Distance:   Left Eye Distance:   Bilateral Distance:    Right Eye Near:   Left Eye Near:    Bilateral Near:     Physical Exam Constitutional:      General: She is not in acute distress.    Appearance: She is well-developed.  HENT:     Right Ear: Tympanic membrane and ear canal normal.     Left Ear: Tympanic membrane and ear canal normal.     Nose: Mucosal edema present.  Cardiovascular:     Rate  and Rhythm: Normal rate.  Pulmonary:     Effort: Pulmonary effort is normal.  Skin:    General: Skin is warm and dry.  Neurological:     Mental Status: She is alert and oriented to person, place, and time.      UC Treatments / Results  Labs (all labs ordered are listed, but only abnormal results are displayed) Labs Reviewed  SARS CORONAVIRUS 2 (TAT 6-24 HRS)    EKG   Radiology No results found.  Procedures Procedures (including critical care time)  Medications Ordered in UC Medications - No data to display  Initial Impression / Assessment and Plan / UC Course  I have reviewed the triage vital signs and the nursing notes.  Pertinent labs & imaging results that were available during my care of the patient were reviewed by me and considered in my medical decision making (see chart for details).     Non toxic. Benign physical exam.  History and physical consistent with viral illness.  Patient requesting covid testing in order for documentation for her work. Supportive cares recommended. Return precautions provided. Patient verbalized understanding and agreeable to plan.   Final Clinical Impressions(s) / UC Diagnoses   Final diagnoses:  Acute upper respiratory infection     Discharge Instructions     Push fluids to ensure adequate hydration and keep secretions thin.  Tylenol and/or ibuprofen as needed for pain or fevers.  Over the counter medications such as mucinex or mucinex d to help with congestion. Ibuprofen every 6-8 hours to help with pain.  Flonase daily. Ipratropium nasal spray every 6 hours as needed for congestion, such as before bed.  Self isolate until covid results are back and negative.  Will notify you by phone of any positive findings. Your negative results will be sent through your MyChart.     If symptoms worsen or do not improve in the next week to return to be seen or to follow up with your PCP.       ED Prescriptions    Medication Sig  Dispense Auth. Provider   fluticasone (FLONASE) 50 MCG/ACT  nasal spray Place 1 spray into both nostrils daily. 16 g Linus Mako B, NP   ipratropium (ATROVENT) 0.06 % nasal spray Place 2 sprays into both nostrils every 6 (six) hours as needed for rhinitis. 15 mL Georgetta Haber, NP     PDMP not reviewed this encounter.   Georgetta Haber, NP 08/11/20 [phone]

## 2020-08-11 NOTE — ED Triage Notes (Signed)
Pt presents with sinus congestion, bilateral ear pain, sinus headache, and sore throat for past few days; pt states she had a negative covid test on Monday 07/30

## 2020-08-11 NOTE — Discharge Instructions (Addendum)
Push fluids to ensure adequate hydration and keep secretions thin.  Tylenol and/or ibuprofen as needed for pain or fevers.  Over the counter medications such as mucinex or mucinex d to help with congestion. Ibuprofen every 6-8 hours to help with pain.  Flonase daily. Ipratropium nasal spray every 6 hours as needed for congestion, such as before bed.  Self isolate until covid results are back and negative.  Will notify you by phone of any positive findings. Your negative results will be sent through your MyChart.     If symptoms worsen or do not improve in the next week to return to be seen or to follow up with your PCP.

## 2020-09-08 ENCOUNTER — Encounter: Payer: Self-pay | Admitting: Internal Medicine

## 2020-09-08 ENCOUNTER — Other Ambulatory Visit: Payer: Self-pay

## 2020-09-08 ENCOUNTER — Ambulatory Visit (INDEPENDENT_AMBULATORY_CARE_PROVIDER_SITE_OTHER): Payer: BC Managed Care – PPO | Admitting: Internal Medicine

## 2020-09-08 VITALS — BP 140/90 | HR 87 | Temp 98.7°F | Ht 66.0 in | Wt 220.2 lb

## 2020-09-08 DIAGNOSIS — Z0000 Encounter for general adult medical examination without abnormal findings: Secondary | ICD-10-CM

## 2020-09-08 DIAGNOSIS — R030 Elevated blood-pressure reading, without diagnosis of hypertension: Secondary | ICD-10-CM

## 2020-09-08 DIAGNOSIS — E669 Obesity, unspecified: Secondary | ICD-10-CM | POA: Diagnosis not present

## 2020-09-08 NOTE — Patient Instructions (Signed)
-  Nice seeing you today!!  -Check blood pressure 2-3 times a week and bring measurements into your next visit.  -Schedule follow up in 3 months.

## 2020-09-08 NOTE — Progress Notes (Signed)
Established Patient Office Visit     This visit occurred during the SARS-CoV-2 public health emergency.  Safety protocols were in place, including screening questions prior to the visit, additional usage of staff PPE, and extensive cleaning of exam room while observing appropriate contact time as indicated for disinfecting solutions.    CC/Reason for Visit: Establish care, annual preventive exam  HPI: Emily Horne is a 29 y.o. female who is coming in today for the above mentioned reasons.  She has no past medical history of significance.  She has not had routine medical care in years.  She works as a Engineer, mining for a call center.  She lives with her sister.  She does not smoke cigarettes although she smokes marijuana 3 times a day, she drinks significant amounts of alcohol, she states she gets drunk routinely on the weekends, she has allergies to sulfa drugs which cause hives, no past surgical history, she does not take any medications.  Her family history is only significant for a maternal grandfather with history of colon cancer.She sees GYN, reports she was last seen in 2019, to manage her birth control, she has a Nexplanon.  She is overdue for flu, COVID, HPV vaccines.  She is obese.   Past Medical/Surgical History: Past Medical History:  Diagnosis Date  . Allergy   . Chlamydia   . Herpes simplex without mention of complication   . UTI (urinary tract infection)     Past Surgical History:  Procedure Laterality Date  . NO PAST SURGERIES      Social History:  reports that she has never smoked. She has never used smokeless tobacco. She reports current alcohol use. She reports current drug use. Drug: Marijuana.  Allergies: Allergies  Allergen Reactions  . Bactrim [Sulfamethoxazole-Trimethoprim] Other (See Comments)    Cold sweats all night  . Sulfamethoxazole-Trimethoprim Nausea Only    Family History:  Family History  Problem Relation Age of Onset  . Diabetes  Father   . Asthma Father   . Migraines Mother      Current Outpatient Medications:  .  etonogestrel (NEXPLANON) 68 MG IMPL implant, 68 mg by Subdermal route once. , Disp: , Rfl:   Review of Systems:  Constitutional: Denies fever, chills, diaphoresis, appetite change and fatigue.  HEENT: Denies photophobia, eye pain, redness, hearing loss, ear pain, congestion, sore throat, rhinorrhea, sneezing, mouth sores, trouble swallowing, neck pain, neck stiffness and tinnitus.   Respiratory: Denies SOB, DOE, cough, chest tightness,  and wheezing.   Cardiovascular: Denies chest pain, palpitations and leg swelling.  Gastrointestinal: Denies nausea, vomiting, abdominal pain, diarrhea, constipation, blood in stool and abdominal distention.  Genitourinary: Denies dysuria, urgency, frequency, hematuria, flank pain and difficulty urinating.  Endocrine: Denies: hot or cold intolerance, sweats, changes in hair or nails, polyuria, polydipsia. Musculoskeletal: Denies myalgias, back pain, joint swelling, arthralgias and gait problem.  Skin: Denies pallor, rash and wound.  Neurological: Denies dizziness, seizures, syncope, weakness, light-headedness, numbness and headaches.  Hematological: Denies adenopathy. Easy bruising, personal or family bleeding history  Psychiatric/Behavioral: Denies suicidal ideation, mood changes, confusion, nervousness, sleep disturbance and agitation    Physical Exam: Vitals:   09/08/20 1519  BP: 140/90  Pulse: 87  Temp: 98.7 F (37.1 C)  TempSrc: Oral  SpO2: 98%  Weight: 220 lb 3.2 oz (99.9 kg)  Height: 5\' 6"  (1.676 m)    Body mass index is 35.54 kg/m.   Constitutional: NAD, calm, comfortable Eyes: PERRL, lids and conjunctivae normal  ENMT: Mucous membranes are moist.  Tympanic membrane is pearly white, no erythema or bulging. Neck: normal, supple, no masses, no thyromegaly Respiratory: clear to auscultation bilaterally, no wheezing, no crackles. Normal respiratory  effort. No accessory muscle use.  Cardiovascular: Regular rate and rhythm, no murmurs / rubs / gallops. No extremity edema. 2+ pedal pulses. Abdomen: no tenderness, no masses palpated. No hepatosplenomegaly. Bowel sounds positive.  Musculoskeletal: no clubbing / cyanosis. No joint deformity upper and lower extremities. Good ROM, no contractures. Normal muscle tone.  Skin: no rashes, lesions, ulcers. No induration Neurologic: CN 2-12 grossly intact. Sensation intact, DTR normal. Strength 5/5 in all 4.  Psychiatric: Normal judgment and insight. Alert and oriented x 3. Normal mood.    Impression and Plan:  Encounter for preventive health examination -She has routine eye and dental care. -She is due for Covid, flu, HPV vaccine this, she declines all today. -No need for labs today. -Healthy lifestyle has been discussed in great detail. -Commence routine colon cancer screening age 47 and breast cancer screening age 25. -She follows up with GYN every 2 years or so.  Obesity (BMI 35.0-39.9 without comorbidity) -Discussed healthy lifestyle, including increased physical activity and better food choices to promote weight loss.  Elevated BP without diagnosis of hypertension -She will do ambulatory blood pressure monitoring and return in 3 months for follow-up.    Patient Instructions  -Nice seeing you today!!  -Check blood pressure 2-3 times a week and bring measurements into your next visit.  -Schedule follow up in 3 months.     Chaya Jan, MD [facility]

## 2020-10-11 DIAGNOSIS — Z20822 Contact with and (suspected) exposure to covid-19: Secondary | ICD-10-CM | POA: Diagnosis not present

## 2020-10-29 IMAGING — CR DG FOOT COMPLETE 3+V*R*
3 series · 3 of 3 positions shown · non-contrast
Comparison: None.

CLINICAL DATA: Glass in foot.

EXAM:
RIGHT FOOT COMPLETE - 3+ VIEW

[foot ap]
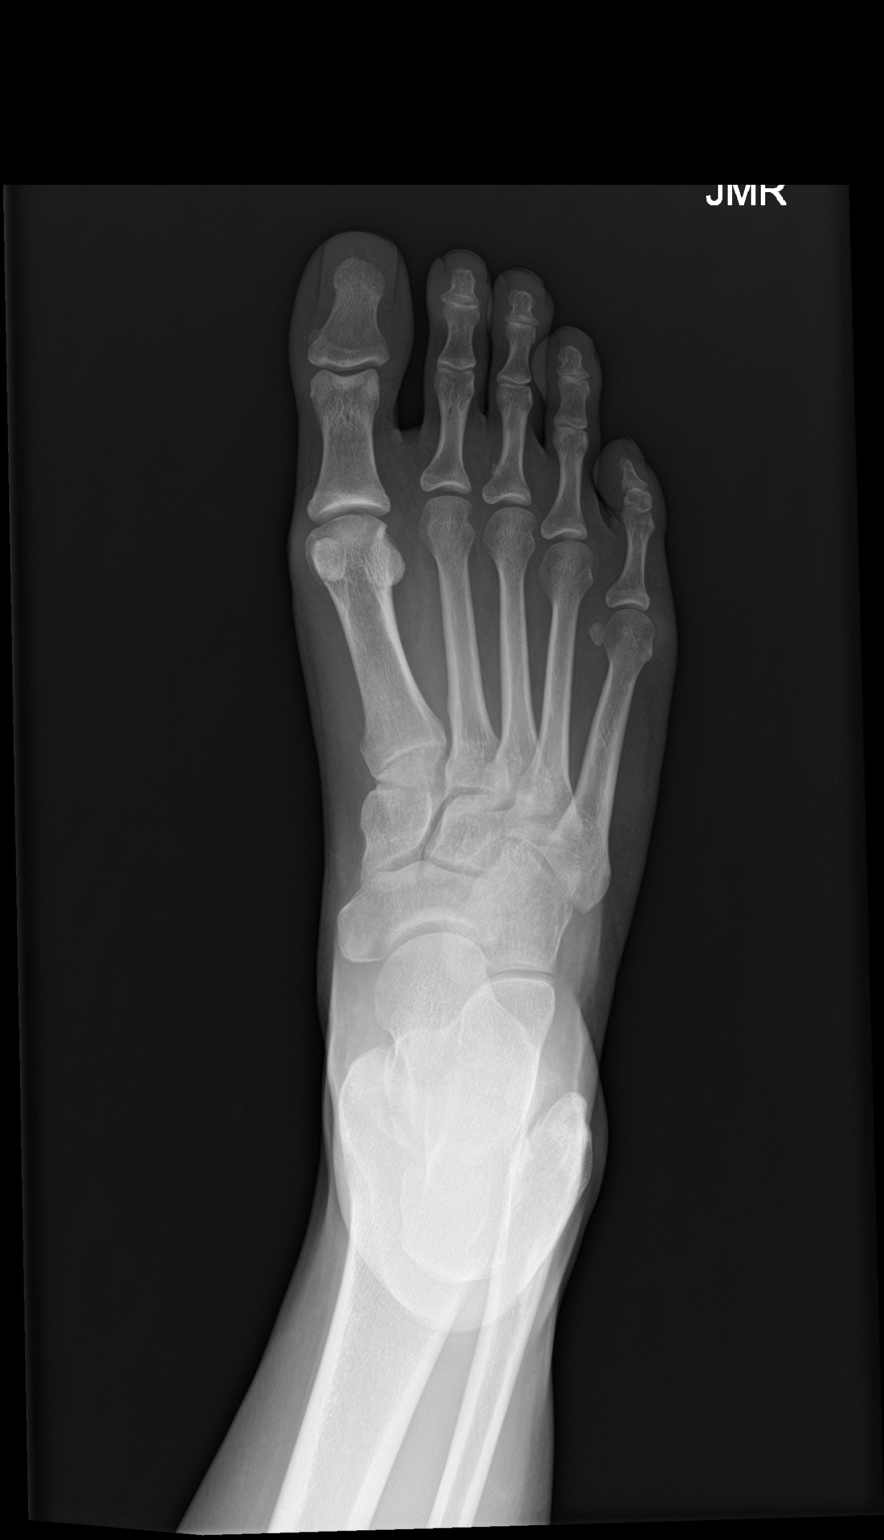

[foot obl]
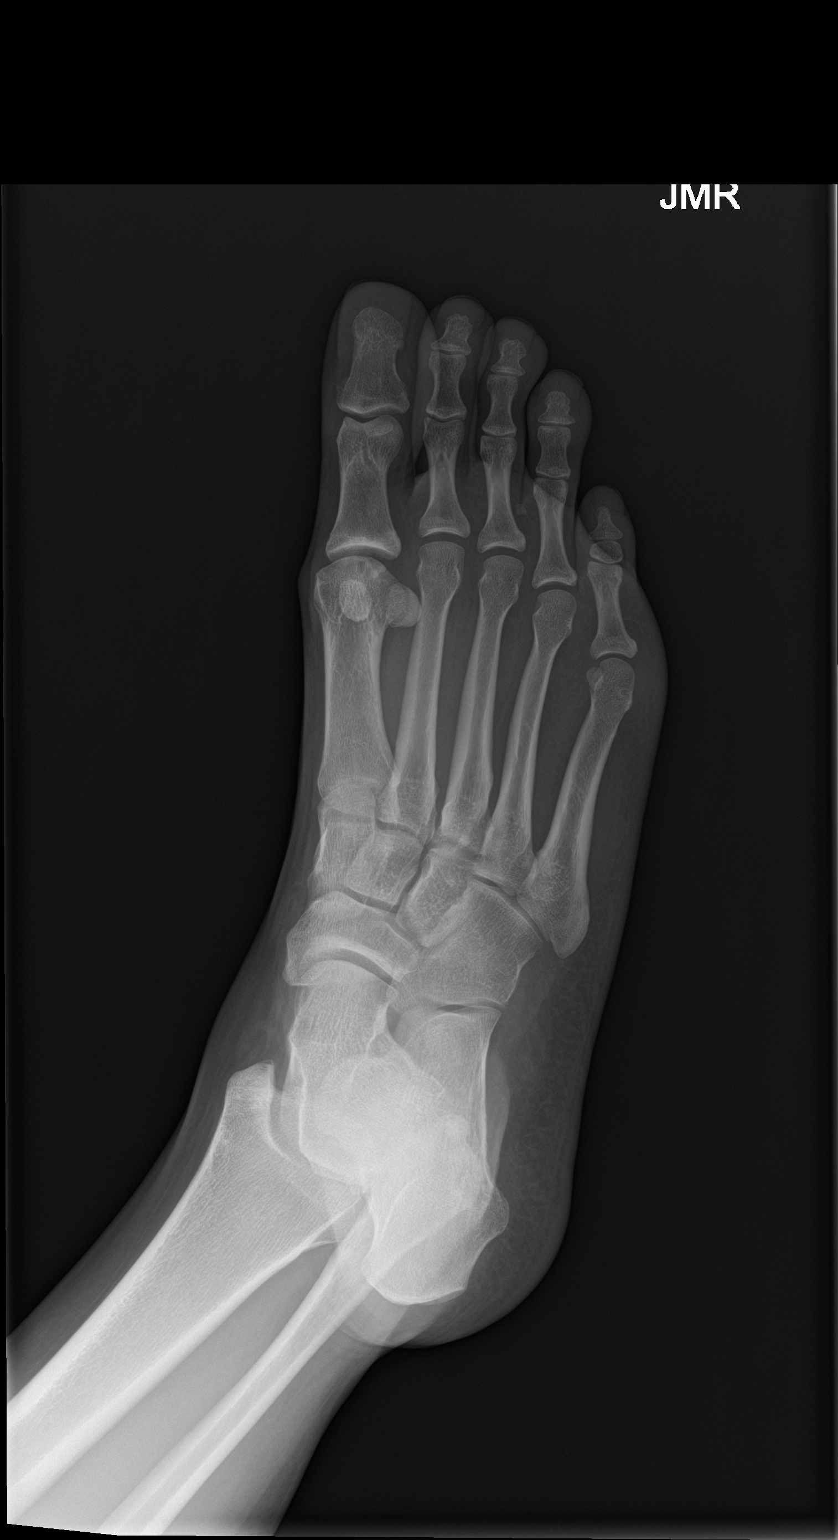

[foot lat]
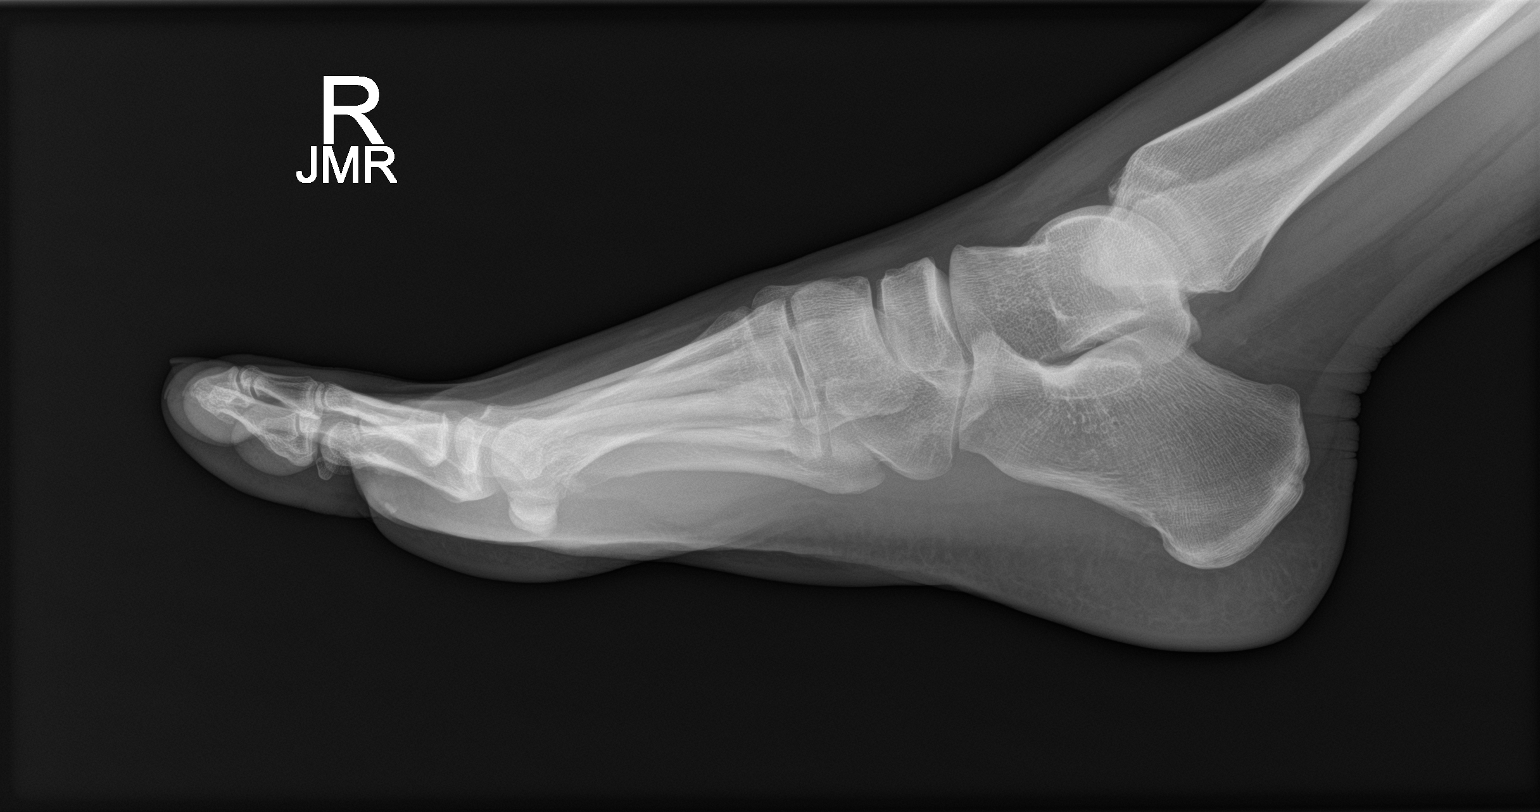

[3 of 3 positions shown; findings below may reference images not displayed]

FINDINGS: There is no acute displaced fracture or dislocation. There is a
radiopaque foreign body in the plantar soft tissues of the foot at
the level of the proximal phalanx of the third digit. There is mild
surrounding soft tissue swelling.
IMPRESSION: Foreign body as detailed above.

## 2020-12-14 ENCOUNTER — Other Ambulatory Visit: Payer: Self-pay

## 2020-12-14 ENCOUNTER — Encounter (HOSPITAL_COMMUNITY): Payer: Self-pay

## 2020-12-14 ENCOUNTER — Ambulatory Visit (HOSPITAL_COMMUNITY)
Admission: EM | Admit: 2020-12-14 | Discharge: 2020-12-14 | Disposition: A | Discharge status: Home / Self Care | Payer: BC Managed Care – PPO | Attending: Family Medicine | Admitting: Family Medicine

## 2020-12-14 DIAGNOSIS — J111 Influenza due to unidentified influenza virus with other respiratory manifestations: Secondary | ICD-10-CM

## 2020-12-14 DIAGNOSIS — U071 COVID-19: Secondary | ICD-10-CM | POA: Diagnosis not present

## 2020-12-14 LAB — RESP PANEL BY RT-PCR (FLU A&B, COVID) ARPGX2
Influenza A by PCR: NEGATIVE
Influenza B by PCR: NEGATIVE
SARS Coronavirus 2 by RT PCR: POSITIVE — AB

## 2020-12-14 MED ORDER — ONDANSETRON HCL 4 MG PO TABS: 4.0000 mg | ORAL_TABLET | Freq: Three times a day (TID) | ORAL | 0 refills | Status: DC | PRN

## 2020-12-14 NOTE — ED Provider Notes (Signed)
[facility]    CSN: [id number] Arrival date & time: 12/14/20  6314      History   Chief Complaint Chief Complaint  Patient presents with  . Headache  . Nausea  . Generalized Body Aches  . Chills    HPI Emily Horne is a 30 y.o. female.   Patient started feeling bad Sunday night.  She couldn't sleep bc her whole body was aching.  It Started with both legs and now includes the back.    She took some ibuprofen PM yesterday and was able to get some sleep.  She woke up this morning with chills, headache, room spinning sensation, which caused her to vomit twice. No longer has sensation of dizziness.  developed a productive cough today.  Denies sore throat or chest pain.  No abdominal pain.  She had one episode of nonbloody diarrhea this morning.  No vaccination against flu or covid.  Has no known exposures.  She spent some time with her family for new years.  No difficulty breathing.  No blurred vision. She has been taking ibuprofen and mucinex.       Past Medical History:  Diagnosis Date  . Allergy   . Chlamydia   . Herpes simplex without mention of complication   . UTI (urinary tract infection)     Patient Active Problem List   Diagnosis Date Noted  . Laceration of labia--1st degree, right, due to trauma 11/26/2013  . Well adult exam 05/16/2013  . 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 History of chlamydia infection 03/13/2013  . ACNE 12/25/2008  . ALLERGIC RHINITIS 03/25/2008    Past Surgical History:  Procedure Laterality Date  . NO PAST SURGERIES      OB History    Gravida  1   Para  0   Term  0   Preterm      AB  1   Living  0     SAB  1   IAB      Ectopic      Multiple      Live Births               Home Medications    Prior to Admission medications   Medication Sig Start Date End Date Taking? Authorizing Provider  ondansetron (ZOFRAN) 4 MG tablet Take 1 tablet (4 mg total) by mouth every 8 (eight) hours as needed for nausea  or vomiting. 12/14/20  Yes Sandre Kitty, MD  etonogestrel (NEXPLANON) 68 MG IMPL implant 68 mg by Subdermal route once.     [provider]    Family History Family History  Problem Relation Age of Onset  . Diabetes Father   . Asthma Father   . Migraines Mother   . Colon cancer Maternal Grandfather     Social History Social History   Tobacco Use  . Smoking status: Never Smoker  . Smokeless tobacco: Never Used  Substance Use Topics  . Alcohol use: Yes    Comment: socialy  . Drug use: Yes    Types: Marijuana     Allergies   Bactrim [sulfamethoxazole-trimethoprim] and Sulfamethoxazole-trimethoprim   Review of Systems Review of Systems  All other systems reviewed and are negative.    Physical Exam Triage Vital Signs ED Triage Vitals  Enc Vitals Group     BP 12/14/20 1023 (!) 139/103     Pulse Rate 12/14/20 1023 98     Resp 12/14/20 1023 17  Temp 12/14/20 1023 (!) 100.5 F (38.1 C)     Temp Source 12/14/20 1023 Oral     SpO2 12/14/20 1023 99 %     Weight --      Height --      Head Circumference --      Peak Flow --      Pain Score 12/14/20 1025 7     Pain Loc --      Pain Edu? --      Excl. in [place]? --    No data found.  Updated Vital Signs BP (!) 139/103 (BP Location: Right Arm)   Pulse 98   Temp (!) 100.5 F (38.1 C) (Oral)   Resp 17   SpO2 99%   Visual Acuity Right Eye Distance:   Left Eye Distance:   Bilateral Distance:    Right Eye Near:   Left Eye Near:    Bilateral Near:     Physical Exam Vitals reviewed.  HENT:     Mouth/Throat:     Mouth: Mucous membranes are moist.     Pharynx: Oropharynx is clear.  Eyes:     Conjunctiva/sclera: Conjunctivae normal.  Cardiovascular:     Rate and Rhythm: Normal rate and regular rhythm.     Pulses: Normal pulses.     Heart sounds: Normal heart sounds. No murmur heard.   Pulmonary:     Effort: Pulmonary effort is normal. No respiratory distress.     Breath sounds: Normal breath  sounds. No wheezing.  Abdominal:     General: Abdomen is flat. There is no distension.     Palpations: Abdomen is soft. There is no mass.     Tenderness: There is abdominal tenderness.  Skin:    General: Skin is warm and dry.  Neurological:     Mental Status: She is alert.  Psychiatric:        Mood and Affect: Mood normal.      UC Treatments / Results  Labs (all labs ordered are listed, but only abnormal results are displayed) Labs Reviewed  RESP PANEL BY RT-PCR (FLU A&B, COVID) [id number]    EKG   Radiology No results found.  Procedures Procedures (including critical care time)  Medications Ordered in UC Medications - No data to display  Initial Impression / Assessment and Plan / UC Course  I have reviewed the triage vital signs and the nursing notes.  Pertinent labs & imaging results that were available during my care of the patient were reviewed by me and considered in my medical decision making (see chart for details).     Patient presents with symptoms of viral illness and fever, most likely due to Covid or flu (patient is unvaccinated against either.).  Will test for both Covid and flu and discussed results with patient when we get them.  Outside of the window where Tamiflu would be helpful.  Prescribed Zofran for nausea.  Discussed symptomatic treatment including Tylenol and ibuprofen.  Discussed return precautions and red flag symptoms including respiratory distress with patient.  Advised patient to isolate for at least 5 days from the onset of symptoms or until the test come back negative.   Final Clinical Impressions(s) / UC Diagnoses   Final diagnoses:  Influenza-like illness     Discharge Instructions     We are testing for you COVID and flu.  The results will come back in 24-48 hours.  We will call you with the results and further instructions at that time.  Until then please isolate yourself from others.  Treatment is symptomatic with ibuprofen and  tylenol for pain and fever.  I have prescribed zofran to take as needed for nausea.  Try to stay hydrated. If you develop any difficulty breathing please seek medical attention.      ED Prescriptions    Medication Sig Dispense Auth. Provider   ondansetron (ZOFRAN) 4 MG tablet Take 1 tablet (4 mg total) by mouth every 8 (eight) hours as needed for nausea or vomiting. 20 tablet Sandre Kitty, MD     PDMP not reviewed this encounter.   Sandre Kitty, MD 12/14/20 [phone]

## 2020-12-14 NOTE — Discharge Instructions (Addendum)
We are testing for you COVID and flu.  The results will come back in 24-48 hours.  We will call you with the results and further instructions at that time.  Until then please isolate yourself from others.  Treatment is symptomatic with ibuprofen and tylenol for pain and fever.  I have prescribed zofran to take as needed for nausea.  Try to stay hydrated. If you develop any difficulty breathing please seek medical attention.

## 2020-12-14 NOTE — ED Triage Notes (Signed)
Pt presents with generalized body aches, chills, fever, headache, and nausea X 2 days.

## 2022-01-11 ENCOUNTER — Emergency Department (HOSPITAL_COMMUNITY)
Admission: EM | Admit: 2022-01-11 | Discharge: 2022-01-11 | Disposition: A | Discharge status: Home / Self Care | Payer: Managed Care, Other (non HMO) | Attending: Emergency Medicine | Admitting: Emergency Medicine

## 2022-01-11 ENCOUNTER — Emergency Department (HOSPITAL_COMMUNITY): Payer: Managed Care, Other (non HMO)

## 2022-01-11 ENCOUNTER — Encounter (HOSPITAL_COMMUNITY): Payer: Self-pay

## 2022-01-11 DIAGNOSIS — X58XXXA Exposure to other specified factors, initial encounter: Secondary | ICD-10-CM | POA: Insufficient documentation

## 2022-01-11 DIAGNOSIS — N898 Other specified noninflammatory disorders of vagina: Secondary | ICD-10-CM | POA: Insufficient documentation

## 2022-01-11 DIAGNOSIS — R58 Hemorrhage, not elsewhere classified: Secondary | ICD-10-CM

## 2022-01-11 DIAGNOSIS — S3141XA Laceration without foreign body of vagina and vulva, initial encounter: Secondary | ICD-10-CM | POA: Diagnosis not present

## 2022-01-11 DIAGNOSIS — S3993XA Unspecified injury of pelvis, initial encounter: Secondary | ICD-10-CM

## 2022-01-11 DIAGNOSIS — N939 Abnormal uterine and vaginal bleeding, unspecified: Secondary | ICD-10-CM | POA: Diagnosis present

## 2022-01-11 LAB — CBC WITH DIFFERENTIAL/PLATELET
Abs Immature Granulocytes: 0.04 10*3/uL (ref 0.00–0.07)
Basophils Absolute: 0 10*3/uL (ref 0.0–0.1)
Basophils Relative: 0 %
Eosinophils Absolute: 0.2 10*3/uL (ref 0.0–0.5)
Eosinophils Relative: 2 %
HCT: 39.4 % (ref 36.0–46.0)
Hemoglobin: 12.8 g/dL (ref 12.0–15.0)
Immature Granulocytes: 0 %
Lymphocytes Relative: 24 %
Lymphs Abs: 2.7 10*3/uL (ref 0.7–4.0)
MCH: 27.9 pg (ref 26.0–34.0)
MCHC: 32.5 g/dL (ref 30.0–36.0)
MCV: 85.8 fL (ref 80.0–100.0)
Monocytes Absolute: 0.5 10*3/uL (ref 0.1–1.0)
Monocytes Relative: 5 %
Neutro Abs: 8 10*3/uL — ABNORMAL HIGH (ref 1.7–7.7)
Neutrophils Relative %: 69 %
Platelets: 436 10*3/uL — ABNORMAL HIGH (ref 150–400)
RBC: 4.59 MIL/uL (ref 3.87–5.11)
RDW: 14.9 % (ref 11.5–15.5)
WBC: 11.6 10*3/uL — ABNORMAL HIGH (ref 4.0–10.5)
nRBC: 0 % (ref 0.0–0.2)

## 2022-01-11 LAB — COMPREHENSIVE METABOLIC PANEL WITH GFR
ALT: 18 U/L (ref 0–44)
AST: 15 U/L (ref 15–41)
Albumin: 3.5 g/dL (ref 3.5–5.0)
Alkaline Phosphatase: 78 U/L (ref 38–126)
Anion gap: 8 (ref 5–15)
BUN: 11 mg/dL (ref 6–20)
CO2: 23 mmol/L (ref 22–32)
Calcium: 8.7 mg/dL — ABNORMAL LOW (ref 8.9–10.3)
Chloride: 107 mmol/L (ref 98–111)
Creatinine, Ser: 0.61 mg/dL (ref 0.44–1.00)
GFR, Estimated: >60 mL/min
Glucose, Bld: 107 mg/dL — ABNORMAL HIGH (ref 70–99)
Potassium: 3.4 mmol/L — ABNORMAL LOW (ref 3.5–5.1)
Sodium: 138 mmol/L (ref 135–145)
Total Bilirubin: 0.6 mg/dL (ref 0.3–1.2)
Total Protein: 7.5 g/dL (ref 6.5–8.1)

## 2022-01-11 LAB — WET PREP, GENITAL
Clue Cells Wet Prep HPF POC: NONE SEEN
Sperm: NONE SEEN
Trich, Wet Prep: NONE SEEN
WBC, Wet Prep HPF POC: >=10 — AB
Yeast Wet Prep HPF POC: NONE SEEN

## 2022-01-11 LAB — I-STAT BETA HCG BLOOD, ED (MC, WL, AP ONLY): I-stat hCG, quantitative: <5 m[IU]/mL (ref <5)

## 2022-01-11 MED ORDER — LIDOCAINE 2 % EX GEL: 1.0000 "application " | Freq: Three times a day (TID) | CUTANEOUS | 0 refills | Status: AC | PRN

## 2022-01-11 MED ORDER — LIDOCAINE 2 % EX GEL: 1.0000 "application " | Freq: Three times a day (TID) | CUTANEOUS | 0 refills | Status: DC | PRN

## 2022-01-11 NOTE — ED Provider Triage Note (Signed)
Emergency Medicine Provider Triage Evaluation Note  Emily Horne , a 31 y.o. female  was evaluated in triage.  Pt complains of vaginal bleeding.  Review of Systems  Positive: Bleeding  Negative: No abd pain  Physical Exam  BP (!) 137/100 (BP Location: Left Arm)    Pulse 76    Temp 98.4 F (36.9 C) (Oral)    Resp 16    SpO2 100%  Gen:   Awake, no distress   Resp:  Normal effort  MSK:   Moves extremities without difficulty   Medical Decision Making  Medically screening exam initiated at 11:12 AM.  Appropriate orders placed.  Emily Horne was informed that the remainder of the evaluation will be completed by another provider, this initial triage assessment does not replace that evaluation, and the importance of remaining in the ED until their evaluation is complete.  Pelvic US and labs ordered.   Emily Kaplan, MD 01/11/22 1113

## 2022-01-11 NOTE — ED Triage Notes (Signed)
Pt c/o vaginal bleeding since last night after sexual intercourse.States she say a couple clots. Denies any cramping/pain. Concerned for miscarriage. Currently has nexplanon implant.

## 2022-01-11 NOTE — Discharge Instructions (Addendum)
You were seen in the ER for vaginal injury.  Our evaluation indicates that most likely you have a laceration to the top of her vaginal area.  Perform sitz bath 3 times a day and after that - apply topical lidocaine prescribed.  If you start having heavy bleeding, return to the ER.  Follow-up with gynecologist.

## 2022-01-11 NOTE — ED Notes (Signed)
I provided reinforced discharge education based off of discharge instructions. Pt acknowledged and understood my education. Pt had no further questions/concerns for provider/myself.  °

## 2022-01-11 NOTE — ED Provider Notes (Addendum)
[facility]-EMERGENCY DEPT Provider Note   CSN: [id number] Arrival date & time: 01/11/22  1012     History  Chief Complaint  Patient presents with   Vaginal Bleeding    Emily Horne is a 31 y.o. female.  HPI     Pt comes in with cc of vaginal bleeding. Symptoms started last night. She did have intercourse. She denies any uti like symptoms. She has no hx of sti and is in a monogamous relationship. G1P0 with hx of miscarriage. No abd pain.  Home Medications Prior to Admission medications   Medication Sig Start Date End Date Taking? Authorizing Provider  Lidocaine 2 % GEL Apply 1 application topically 3 (three) times daily as needed for up to 3 days. 01/11/22 01/14/22 Yes Derwood Kaplan, MD  etonogestrel (NEXPLANON) 68 MG IMPL implant 68 mg by Subdermal route once.     [provider]  ondansetron (ZOFRAN) 4 MG tablet Take 1 tablet (4 mg total) by mouth every 8 (eight) hours as needed for nausea or vomiting. 12/14/20   Sandre Kitty, MD      Allergies    Bactrim [sulfamethoxazole-trimethoprim] and Sulfamethoxazole-trimethoprim    Review of Systems   Review of Systems  Constitutional:  Positive for activity change.  Gastrointestinal:  Negative for abdominal pain.  Genitourinary:  Positive for vaginal bleeding.  Hematological:  Does not bruise/bleed easily.  All other systems reviewed and are negative.  Physical Exam Updated Vital Signs BP (!) 137/100 (BP Location: Left Arm)    Pulse 76    Temp 98.4 F (36.9 C) (Oral)    Resp 16    SpO2 100%  Physical Exam Vitals and nursing note reviewed.  Constitutional:      Appearance: She is well-developed.  HENT:     Head: Normocephalic and atraumatic.  Eyes:     Conjunctiva/sclera: Conjunctivae normal.     Pupils: Pupils are equal, round, and reactive to light.  Cardiovascular:     Rate and Rhythm: Normal rate and regular rhythm.  Pulmonary:     Effort: Pulmonary effort is normal. No  respiratory distress.     Breath sounds: No wheezing.  Abdominal:     General: Bowel sounds are normal. There is no distension.     Palpations: Abdomen is soft.     Tenderness: There is no abdominal tenderness. There is no guarding or rebound.  Genitourinary:    Vagina: Normal.     Comments: External exam - normal, no lesions -  around the clitorris there is a superficial laceration about 2 cm in length, no active bleed Speculum exam: Pt has some white discharge, no blood Bimanual exam: Patient has no CMT, no adnexal tenderness or fullness and cervical os is closed Musculoskeletal:     Cervical back: Normal range of motion and neck supple.  Skin:    General: Skin is warm and dry.  Neurological:     Mental Status: She is alert and oriented to person, place, and time.    ED Results / Procedures / Treatments   Labs (all labs ordered are listed, but only abnormal results are displayed) Labs Reviewed  CBC WITH DIFFERENTIAL/PLATELET - Abnormal; Notable for the following components:      Result Value   WBC 11.6 (*)    Platelets 436 (*)    Neutro Abs 8.0 (*)    All other components within normal limits  COMPREHENSIVE METABOLIC PANEL - Abnormal; Notable for the following components:   Potassium  3.4 (*)    Glucose, Bld 107 (*)    Calcium 8.7 (*)    All other components within normal limits  WET PREP, GENITAL  I-STAT BETA HCG BLOOD, ED ([facility], [facility], [facility] ONLY)  GC/CHLAMYDIA PROBE AMP ([place]) NOT AT [facility]    EKG None  Radiology US PELVIC COMPLETE WITH TRANSVAGINAL  Result Date: 01/11/2022 CLINICAL DATA:  Dysfunctional uterine bleeding. EXAM: TRANSABDOMINAL AND TRANSVAGINAL ULTRASOUND OF PELVIS TECHNIQUE: Both transabdominal and transvaginal ultrasound examinations of the pelvis were performed. Transabdominal technique was performed for global imaging of the pelvis including uterus, ovaries, adnexal regions, and pelvic cul-de-sac. It was necessary to proceed with endovaginal exam  following the transabdominal exam to visualize the endometrium. COMPARISON:  None FINDINGS: Uterus Measurements: 7 x 2.8 x 3.4 cm = volume: 35.7 mL. No fibroids or other mass visualized. Endometrium Thickness: 1.7 mm.  No focal abnormality visualized. Right ovary Measurements: 2.4 x 1.5 x 1.1 cm = volume: 2.1 mL. Normal appearance/no adnexal mass. Left ovary Measurements: 3 x 2.1 x 2 cm = volume: 6.6 mL. Normal appearance/no adnexal mass. Other findings Small amount of pelvic free fluid likely physiologic. IMPRESSION: 1. Normal pelvic ultrasound. If bleeding remains unresponsive to hormonal or medical therapy, sonohysterogram should be considered for focal lesion work-up. (Ref: Radiological Reasoning: Algorithmic Workup of Abnormal Vaginal Bleeding with Endovaginal Sonography and Sonohysterography. AJR 2008; 710:G26-94) Electronically Signed   By: Elige Ko M.D.   On: 01/11/2022 12:28    Procedures Procedures    Medications Ordered in ED Medications - No data to display  ED Course/ Medical Decision Making/ A&P Clinical Course as of 01/11/22 1517  Wed Jan 11, 2022  1511 Ultrasound is reassuring. Pelvic exam did reveal a laceration -so we suspect her symptoms are because of a superficial lack.  She did have intercourse yesterday and the bleeding started thereafter.  No suspicion for UTI at this time.  Will prescribe topical meds. [AN]    Clinical Course User Index [AN] Derwood Kaplan, MD                           Medical Decision Making DDx includes: Ectopic pregnancy Threatened abortion Inevitable abortion Miscarriage Spontaneous abortion Trauma/cerval laceration Placenta previa Placenta abruptio Ruptured uterus  Pt comes in with cc of vaginal bleeding. Intercourse yday, bleeding started after. No uti like symptoms. [place] is neg for acute findings. Pelvic exam is reassuring - no direct trauma/laceration noticed.  Will need outpatient follow up with Gyne.   Problems  Addressed: Bleeding: undiagnosed new problem with uncertain prognosis Vaginal injury, initial encounter: complicated acute illness or injury  Amount and/or Complexity of Data Reviewed Labs: ordered. Decision-making details documented in ED Course. Radiology: ordered.     Final Clinical Impression(s) / ED Diagnoses Final diagnoses:  Vaginal injury, initial encounter    Rx / DC Orders ED Discharge Orders          Ordered    Lidocaine 2 % GEL  3 times daily PRN        01/11/22 1515               Derwood Kaplan, MD 01/11/22 1517

## 2022-01-12 LAB — GC/CHLAMYDIA PROBE AMP (~~LOC~~) NOT AT ARMC
Chlamydia: NEGATIVE
Comment: NEGATIVE
Comment: NORMAL
Neisseria Gonorrhea: NEGATIVE

## 2022-02-01 ENCOUNTER — Encounter (HOSPITAL_COMMUNITY): Payer: Self-pay

## 2022-02-01 ENCOUNTER — Other Ambulatory Visit: Payer: Self-pay

## 2022-02-01 ENCOUNTER — Ambulatory Visit (HOSPITAL_COMMUNITY)
Admission: EM | Admit: 2022-02-01 | Discharge: 2022-02-01 | Disposition: A | Discharge status: Home / Self Care | Payer: Managed Care, Other (non HMO) | Attending: Emergency Medicine | Admitting: Emergency Medicine

## 2022-02-01 DIAGNOSIS — J020 Streptococcal pharyngitis: Secondary | ICD-10-CM | POA: Diagnosis not present

## 2022-02-01 LAB — POCT RAPID STREP A, ED / UC: Streptococcus, Group A Screen (Direct): POSITIVE — AB

## 2022-02-01 MED ORDER — PENICILLIN G BENZATHINE 1200000 UNIT/2ML IM SUSY
PREFILLED_SYRINGE | INTRAMUSCULAR | Status: AC
  Filled 2022-02-01: qty 2

## 2022-02-01 MED ORDER — ONDANSETRON 4 MG PO TBDP
ORAL_TABLET | ORAL | Status: AC
  Filled 2022-02-01: qty 1

## 2022-02-01 MED ORDER — ONDANSETRON 4 MG PO TBDP: 4.0000 mg | ORAL_TABLET | Freq: Three times a day (TID) | ORAL | 0 refills | Status: DC | PRN

## 2022-02-01 MED ORDER — PENICILLIN G BENZATHINE 1200000 UNIT/2ML IM SUSY
1.2000 10*6.[IU] | PREFILLED_SYRINGE | Freq: Once | INTRAMUSCULAR | Status: AC
  Administered 2022-02-01: 1.2 10*6.[IU] via INTRAMUSCULAR

## 2022-02-01 MED ORDER — ONDANSETRON 4 MG PO TBDP
4.0000 mg | ORAL_TABLET | Freq: Once | ORAL | Status: AC
  Administered 2022-02-01: 4 mg via ORAL

## 2022-02-01 MED ORDER — LIDOCAINE VISCOUS HCL 2 % MT SOLN: 15.0000 mL | OROMUCOSAL | 0 refills | Status: DC | PRN

## 2022-02-01 NOTE — Discharge Instructions (Addendum)
Make sure to drink plenty of liquids to stay hydrated. It's ok if you don't feel like eating. You can use the lidocaine to help numb your throat pain to make it easier/less painful to swallow.   Consider following up with an ENT specialist about your frequent strep infections.

## 2022-02-01 NOTE — ED Provider Notes (Signed)
[facility]    CSN: [id number] Arrival date & time: 02/01/22  0803      History   Chief Complaint Chief Complaint  Patient presents with   Sore Throat    HPI Emily Horne is a 31 y.o. female. Sore throat and nausea for 2 days.  Feels like she has strep throat.  Reports she gets strep throat at least once a year.  Has never seen an ENT.   Sore Throat Pertinent negatives include no abdominal pain.   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 Authorizing Provider  lidocaine (XYLOCAINE) 2 % solution Use as directed 15 mLs in the mouth or throat as needed for mouth pain. 02/01/22  Yes Cathlyn Parsons, NP  ondansetron (ZOFRAN-ODT) 4 MG disintegrating tablet Take 1 tablet (4 mg total) by mouth every 8 (eight) hours as needed for nausea or vomiting. 02/01/22  Yes Cathlyn Parsons, NP  etonogestrel (NEXPLANON) 68 MG IMPL implant 68 mg by Subdermal route once.     [provider]    Family History Family History  Problem Relation Age of Onset   Diabetes Father    Asthma Father    Migraines Mother    Colon cancer Maternal Grandfather     Social History Social History   Tobacco Use   Smoking status: Never   Smokeless  tobacco: Never  Substance Use Topics   Alcohol use: Yes    Comment: socialy   Drug use: Yes    Types: Marijuana     Allergies   Bactrim [sulfamethoxazole-trimethoprim] and Sulfamethoxazole-trimethoprim   Review of Systems Review of Systems  Constitutional:  Positive for appetite change, chills and fever.  HENT:  Positive for sore throat. Negative for congestion.   Respiratory:  Negative for cough.   Gastrointestinal:  Positive for nausea and vomiting. Negative for abdominal pain.    Physical Exam Triage Vital Signs ED Triage Vitals  Enc Vitals Group     BP 02/01/22 0818 135/90     Pulse Rate 02/01/22 0818 (!) 124     Resp 02/01/22 0818 17     Temp 02/01/22 0818 100.3 F (37.9 C)     Temp Source 02/01/22 0818 Oral     SpO2 02/01/22 0818 94 %     Weight --      Height --      Head Circumference --  Peak Flow --      Pain Score 02/01/22 0816 5     Pain Loc --      Pain Edu? --      Excl. in [place]? --    No data found.  Updated Vital Signs BP 135/90 (BP Location: Right Arm)    Pulse (!) 124    Temp 100.3 F (37.9 C) (Oral)    Resp 17    SpO2 94%   Visual Acuity Right Eye Distance:   Left Eye Distance:   Bilateral Distance:    Right Eye Near:   Left Eye Near:    Bilateral Near:     Physical Exam Constitutional:      Appearance: She is well-developed. She is ill-appearing.  HENT:     Mouth/Throat:     Mouth: Mucous membranes are moist.     Pharynx: Oropharyngeal exudate and posterior oropharyngeal erythema present.     Tonsils: Tonsillar exudate present. 4+ on the right. 4+ on the left.  Cardiovascular:     Rate and Rhythm: Regular rhythm. Tachycardia present.  Pulmonary:     Effort: Pulmonary effort is normal.     Breath sounds: Normal breath sounds.  Neurological:     Mental Status: She is alert.     UC Treatments / Results  Labs (all labs ordered are listed, but only abnormal results are displayed) Labs Reviewed  POCT RAPID STREP A, ED / UC  - Abnormal; Notable for the following components:      Result Value   Streptococcus, Group A Screen (Direct) POSITIVE (*)    All other components within normal limits    EKG   Radiology No results found.  Procedures Procedures (including critical care time)  Medications Ordered in UC Medications  penicillin g benzathine (BICILLIN LA) 1200000 UNIT/2ML injection 1.2 Million Units (has no administration in time range)  ondansetron (ZOFRAN-ODT) disintegrating tablet 4 mg (4 mg Oral Given 02/01/22 6761)    Initial Impression / Assessment and Plan / UC Course  I have reviewed the triage vital signs and the nursing notes.  Pertinent labs & imaging results that were available during my care of the patient were reviewed by me and considered in my medical decision making (see chart for details).     Strep positive.  Patient given Bicillin.  Prescribed Zofran ODT and viscous lidocaine.  Patient given names of ENT practices in [place] follow-up with  Final Clinical Impressions(s) / UC Diagnoses   Final diagnoses:  Strep pharyngitis     Discharge Instructions      Make sure to drink plenty of liquids to stay hydrated. It's ok if you don't feel like eating. You can use the lidocaine to help numb your throat pain to make it easier/less painful to swallow.   Consider following up with an ENT specialist about your frequent strep infections.    ED Prescriptions     Medication Sig Dispense Auth. Provider   ondansetron (ZOFRAN-ODT) 4 MG disintegrating tablet Take 1 tablet (4 mg total) by mouth every 8 (eight) hours as needed for nausea or vomiting. 20 tablet Cathlyn Parsons, NP   lidocaine (XYLOCAINE) 2 % solution Use as directed 15 mLs in the mouth or throat as needed for mouth pain. 100 mL Cathlyn Parsons, NP      PDMP not reviewed this encounter.   Cathlyn Parsons, NP 02/01/22 [phone]

## 2022-02-01 NOTE — ED Triage Notes (Signed)
Pt reports sore throat x 2 days and states she feel nauseas.

## 2022-02-06 ENCOUNTER — Ambulatory Visit (INDEPENDENT_AMBULATORY_CARE_PROVIDER_SITE_OTHER): Payer: Managed Care, Other (non HMO) | Admitting: Internal Medicine

## 2022-02-06 ENCOUNTER — Encounter: Payer: Self-pay | Admitting: Internal Medicine

## 2022-02-06 VITALS — BP 140/90 | HR 98 | Temp 98.5°F | Ht 68.0 in | Wt 207.8 lb

## 2022-02-06 DIAGNOSIS — Z0000 Encounter for general adult medical examination without abnormal findings: Secondary | ICD-10-CM

## 2022-02-06 DIAGNOSIS — I10 Essential (primary) hypertension: Secondary | ICD-10-CM | POA: Diagnosis not present

## 2022-02-06 DIAGNOSIS — Z23 Encounter for immunization: Secondary | ICD-10-CM

## 2022-02-06 DIAGNOSIS — J020 Streptococcal pharyngitis: Secondary | ICD-10-CM | POA: Diagnosis not present

## 2022-02-06 LAB — CBC WITH DIFFERENTIAL/PLATELET
Basophils Absolute: 0.1 10*3/uL (ref 0.0–0.1)
Basophils Relative: 0.6 % (ref 0.0–3.0)
Eosinophils Absolute: 0.2 10*3/uL (ref 0.0–0.7)
Eosinophils Relative: 1.6 % (ref 0.0–5.0)
HCT: 39 % (ref 36.0–46.0)
Hemoglobin: 12.6 g/dL (ref 12.0–15.0)
Lymphocytes Relative: 28.6 % (ref 12.0–46.0)
Lymphs Abs: 3.5 10*3/uL (ref 0.7–4.0)
MCHC: 32.3 g/dL (ref 30.0–36.0)
MCV: 85 fl (ref 78.0–100.0)
Monocytes Absolute: 0.8 10*3/uL (ref 0.1–1.0)
Monocytes Relative: 6.3 % (ref 3.0–12.0)
Neutro Abs: 7.8 10*3/uL — ABNORMAL HIGH (ref 1.4–7.7)
Neutrophils Relative %: 62.9 % (ref 43.0–77.0)
Platelets: 387 10*3/uL (ref 150.0–400.0)
RBC: 4.58 Mil/uL (ref 3.87–5.11)
RDW: 15.2 % (ref 11.5–15.5)
WBC: 12.3 10*3/uL — ABNORMAL HIGH (ref 4.0–10.5)

## 2022-02-06 LAB — COMPREHENSIVE METABOLIC PANEL
ALT: 22 U/L (ref 0–35)
AST: 18 U/L (ref 0–37)
Albumin: 4 g/dL (ref 3.5–5.2)
Alkaline Phosphatase: 88 U/L (ref 39–117)
BUN: 9 mg/dL (ref 6–23)
CO2: 30 mEq/L (ref 19–32)
Calcium: 9.5 mg/dL (ref 8.4–10.5)
Chloride: 103 mEq/L (ref 96–112)
Creatinine, Ser: 0.73 mg/dL (ref 0.40–1.20)
GFR: 111.75 mL/min (ref >60.00)
Glucose, Bld: 100 mg/dL — ABNORMAL HIGH (ref 70–99)
Potassium: 3.4 mEq/L — ABNORMAL LOW (ref 3.5–5.1)
Sodium: 138 mEq/L (ref 135–145)
Total Bilirubin: 0.2 mg/dL (ref 0.2–1.2)
Total Protein: 7.7 g/dL (ref 6.0–8.3)

## 2022-02-06 LAB — LIPID PANEL
Cholesterol: 158 mg/dL (ref 0–200)
HDL: 36.7 mg/dL — ABNORMAL LOW (ref >39.00)
LDL Cholesterol: 100 mg/dL — ABNORMAL HIGH (ref 0–99)
NonHDL: 121.03
Total CHOL/HDL Ratio: 4
Triglycerides: 106 mg/dL (ref 0.0–149.0)
VLDL: 21.2 mg/dL (ref 0.0–40.0)

## 2022-02-06 MED ORDER — AMLODIPINE BESYLATE 5 MG PO TABS: 5.0000 mg | ORAL_TABLET | Freq: Every day | ORAL | 1 refills | Status: DC

## 2022-02-06 NOTE — Patient Instructions (Signed)
-  Nice seeing you today!!  -Lab work today; will notify you once results are available.  -HPV vaccine today.  -Start amlodipine 5 mg daily for your blood pressure.  -Schedule follow up in 6 weeks.

## 2022-02-06 NOTE — Progress Notes (Signed)
Established Patient Office Visit     This visit occurred during the SARS-CoV-2 public health emergency.  Safety protocols were in place, including screening questions prior to the visit, additional usage of staff PPE, and extensive cleaning of exam room while observing appropriate contact time as indicated for disinfecting solutions.    CC/Reason for Visit: Annual preventive exam  HPI: Emily Horne is a 31 y.o. female who is coming in today for the above mentioned reasons.  No past medical history of significance.  She recently had strep throat treated with Bicillin.  She tells me she has 1-2 strep throat infections a year.  Has been told that her tonsils are quite enlarged in the past.  She has been noted to have elevated blood pressure measurement at several visits also in the ED.  She is overdue for flu, COVID, HPV vaccination.  She follows with her GYN for Pap smears and has an appointment next week.  She is overdue for eye care.  She is up-to-date with dental care.  Past Medical/Surgical History: Past Medical History:  Diagnosis Date   Allergy    Chlamydia    Herpes simplex without mention of complication    UTI (urinary tract infection)     Past Surgical History:  Procedure Laterality Date   NO PAST SURGERIES      Social History:  reports that she has never smoked. She has never used smokeless tobacco. She reports current alcohol use. She reports current drug use. Drug: Marijuana.  Allergies: Allergies  Allergen Reactions   Bactrim [Sulfamethoxazole-Trimethoprim] Other (See Comments)    Cold sweats all night   Sulfamethoxazole-Trimethoprim Nausea Only    Family History:  Family History  Problem Relation Age of Onset   Diabetes Father    Asthma Father    Migraines Mother    Colon cancer Maternal Grandfather      Current Outpatient Medications:    amLODipine (NORVASC) 5 MG tablet, Take 1 tablet (5 mg total) by mouth daily., Disp: 90 tablet, Rfl: 1    etonogestrel (NEXPLANON) 68 MG IMPL implant, 68 mg by Subdermal route once. , Disp: , Rfl:    lidocaine (XYLOCAINE) 2 % solution, Use as directed 15 mLs in the mouth or throat as needed for mouth pain., Disp: 100 mL, Rfl: 0   ondansetron (ZOFRAN-ODT) 4 MG disintegrating tablet, Take 1 tablet (4 mg total) by mouth every 8 (eight) hours as needed for nausea or vomiting., Disp: 20 tablet, Rfl: 0  Review of Systems:  Constitutional: Denies fever, chills, diaphoresis, appetite change and fatigue.  HEENT: Denies photophobia, eye pain, redness, hearing loss, ear pain, congestion,  rhinorrhea, sneezing, mouth sores, trouble swallowing, neck pain, neck stiffness and tinnitus.   Respiratory: Denies SOB, DOE, cough, chest tightness,  and wheezing.   Cardiovascular: Denies chest pain, palpitations and leg swelling.  Gastrointestinal: Denies nausea, vomiting, abdominal pain, diarrhea, constipation, blood in stool and abdominal distention.  Genitourinary: Denies dysuria, urgency, frequency, hematuria, flank pain and difficulty urinating.  Endocrine: Denies: hot or cold intolerance, sweats, changes in hair or nails, polyuria, polydipsia. Musculoskeletal: Denies myalgias, back pain, joint swelling, arthralgias and gait problem.  Skin: Denies pallor, rash and wound.  Neurological: Denies dizziness, seizures, syncope, weakness, light-headedness, numbness and headaches.  Hematological: Denies adenopathy. Easy bruising, personal or family bleeding history  Psychiatric/Behavioral: Denies suicidal ideation, mood changes, confusion, nervousness, sleep disturbance and agitation    Physical Exam: Vitals:   02/06/22 0908  BP: 140/90  Pulse:  98  Temp: 98.5 F (36.9 C)  TempSrc: Oral  SpO2: 99%  Weight: 207 lb 12.8 oz (94.3 kg)  Height: 5\' 8"  (1.727 m)    Body mass index is 31.6 kg/m.   Constitutional: NAD, calm, comfortable Eyes: PERRL, lids and conjunctivae normal ENMT: Mucous membranes are moist.  Posterior pharynx is erythematous but clear of any exudate or lesions.  Tonsillar enlargement bilaterally normal dentition. Tympanic membrane is pearly white, no erythema or bulging. Neck: normal, supple, no masses, no thyromegaly Respiratory: clear to auscultation bilaterally, no wheezing, no crackles. Normal respiratory effort. No accessory muscle use.  Cardiovascular: Regular rate and rhythm, no murmurs / rubs / gallops. No extremity edema. 2+ pedal pulses. No carotid bruits.  Abdomen: no tenderness, no masses palpated. No hepatosplenomegaly. Bowel sounds positive.  Musculoskeletal: no clubbing / cyanosis. No joint deformity upper and lower extremities. Good ROM, no contractures. Normal muscle tone.  Skin: no rashes, lesions, ulcers. No induration Neurologic: CN 2-12 grossly intact. Sensation intact, DTR normal. Strength 5/5 in all 4.  Psychiatric: Normal judgment and insight. Alert and oriented x 3. Normal mood.    Impression and Plan:  Encounter for preventive health examination -Recommend routine eye and dental care. -Immunizations: HPV today, she is also due for flu and COVID but declines today. -Healthy lifestyle discussed in detail. -Labs to be updated today. -Colon cancer screening: Commence age 72 -Breast cancer screening: Commence age 70 -Cervical cancer screening: 3 years ago, has follow-up with GYN next month. -Lung cancer screening: Not applicable -Prostate cancer screening: Not applicable -DEXA: Not applicable  Primary hypertension  - Plan: amLODipine (NORVASC) 5 MG tablet, CBC with Differential/Platelet, Comprehensive metabolic panel, Lipid panel -With several in office blood pressure measurements of 140/90 I will go ahead and make this diagnosis today. -Start amlodipine 5 mg daily, return in 6 weeks for follow-up.  Need for HPV vaccination -First HPV vaccine administered in office today.  Strep pharyngitis  - Plan: Ambulatory referral to ENT, for consideration of  tonsillectomy given repeated strep pharyngitis.    Patient Instructions  -Nice seeing you today!!  -Lab work today; will notify you once results are available.  -HPV vaccine today.  -Start amlodipine 5 mg daily for your blood pressure.  -Schedule follow up in 6 weeks.      Lelon Frohlich, MD [facility] at [facility]

## 2022-02-06 NOTE — Addendum Note (Signed)
Addended by: Kern Reap B on: 02/06/2022 09:59 AM   Modules accepted: Orders

## 2022-03-16 LAB — HM PAP SMEAR

## 2022-06-21 ENCOUNTER — Encounter: Payer: Self-pay | Admitting: Family Medicine

## 2022-06-21 ENCOUNTER — Ambulatory Visit (INDEPENDENT_AMBULATORY_CARE_PROVIDER_SITE_OTHER): Payer: Managed Care, Other (non HMO) | Admitting: Family Medicine

## 2022-06-21 ENCOUNTER — Ambulatory Visit: Payer: Managed Care, Other (non HMO)

## 2022-06-21 VITALS — BP 124/82 | Temp 99.5°F | Wt 207.0 lb

## 2022-06-21 DIAGNOSIS — S63501A Unspecified sprain of right wrist, initial encounter: Secondary | ICD-10-CM | POA: Diagnosis not present

## 2022-06-21 NOTE — Progress Notes (Signed)
   Subjective:    Patient ID: Emily Horne, female    DOB: 1991-11-16, 31 y.o.   MRN: [id number]  HPI Here to check her right wrist. About 2 weeks ago she fell forward and caught herself with her arms extended. Since then is has been painful and swollen. Using ice and Ibuprofen.   Review of Systems  Constitutional: Negative.   Respiratory: Negative.    Cardiovascular: Negative.   Musculoskeletal:  Positive for arthralgias.       Objective:   Physical Exam Constitutional:      General: She is not in acute distress.    Appearance: Normal appearance.  Cardiovascular:     Rate and Rhythm: Normal rate and regular rhythm.     Pulses: Normal pulses.     Heart sounds: Normal heart sounds.  Pulmonary:     Effort: Pulmonary effort is normal.     Breath sounds: Normal breath sounds.  Musculoskeletal:     Comments: Right wrist it swollen and tender, ROM is full   Neurological:     Mental Status: She is alert.           Assessment & Plan:  Wrist sprain. She can immobilized it with a splint. Take up to 800 mg of Ibuprofen every 6 hours as needed. We will get Xrays today.  Gershon Crane, MD

## 2022-06-23 ENCOUNTER — Telehealth: Payer: Self-pay | Admitting: Internal Medicine

## 2022-06-23 NOTE — Telephone Encounter (Signed)
Pt called asking if someone could call her back to go over the xray results with her.  Please advise. [phone]

## 2022-06-23 NOTE — Telephone Encounter (Signed)
See result note.  

## 2022-09-21 ENCOUNTER — Encounter: Payer: Self-pay | Admitting: Internal Medicine

## 2022-09-21 ENCOUNTER — Ambulatory Visit (INDEPENDENT_AMBULATORY_CARE_PROVIDER_SITE_OTHER): Payer: Managed Care, Other (non HMO) | Admitting: Internal Medicine

## 2022-09-21 VITALS — BP 130/90 | HR 100 | Temp 98.0°F | Ht 68.0 in | Wt 199.8 lb

## 2022-09-21 DIAGNOSIS — I10 Essential (primary) hypertension: Secondary | ICD-10-CM

## 2022-09-21 MED ORDER — VALSARTAN-HYDROCHLOROTHIAZIDE 160-25 MG PO TABS: 1.0000 | ORAL_TABLET | Freq: Every day | ORAL | 1 refills | Status: DC

## 2022-09-21 MED ORDER — AMLODIPINE BESYLATE 5 MG PO TABS: 5.0000 mg | ORAL_TABLET | Freq: Every day | ORAL | 1 refills | Status: DC

## 2022-09-21 NOTE — Progress Notes (Signed)
Established Patient Office Visit     CC/Reason for Visit: Follow-up blood pressure  HPI: Emily Horne is a 31 y.o. female who is coming in today for the above mentioned reasons. Past Medical History is significant for: Hypertension.  Last visit she was started on amlodipine 5 mg and is here today for follow-up.  She has not been doing ambulatory blood pressure measurements.  She feels well and has no acute concerns or complaints.  She has been adherent.   Past Medical/Surgical History: Past Medical History:  Diagnosis Date   Allergy    Chlamydia    Herpes simplex without mention of complication    UTI (urinary tract infection)     Past Surgical History:  Procedure Laterality Date   NO PAST SURGERIES      Social History:  reports that she has never smoked. She has never used smokeless tobacco. She reports current alcohol use. She reports current drug use. Drug: Marijuana.  Allergies: Allergies  Allergen Reactions   Bactrim [Sulfamethoxazole-Trimethoprim] Other (See Comments)    Cold sweats all night   Sulfamethoxazole-Trimethoprim Nausea Only    Family History:  Family History  Problem Relation Age of Onset   Diabetes Father    Asthma Father    Migraines Mother    Colon cancer Maternal Grandfather      Current Outpatient Medications:    lidocaine (XYLOCAINE) 2 % solution, Use as directed 15 mLs in the mouth or throat as needed for mouth pain., Disp: 100 mL, Rfl: 0   ondansetron (ZOFRAN-ODT) 4 MG disintegrating tablet, Take 1 tablet (4 mg total) by mouth every 8 (eight) hours as needed for nausea or vomiting., Disp: 20 tablet, Rfl: 0   valsartan-hydrochlorothiazide (DIOVAN-HCT) 160-25 MG tablet, Take 1 tablet by mouth daily., Disp: 90 tablet, Rfl: 1   amLODipine (NORVASC) 5 MG tablet, Take 1 tablet (5 mg total) by mouth daily., Disp: 90 tablet, Rfl: 1  Review of Systems:  Constitutional: Denies fever, chills, diaphoresis, appetite change and fatigue.   HEENT: Denies photophobia, eye pain, redness, hearing loss, ear pain, congestion, sore throat, rhinorrhea, sneezing, mouth sores, trouble swallowing, neck pain, neck stiffness and tinnitus.   Respiratory: Denies SOB, DOE, cough, chest tightness,  and wheezing.   Cardiovascular: Denies chest pain, palpitations and leg swelling.  Gastrointestinal: Denies nausea, vomiting, abdominal pain, diarrhea, constipation, blood in stool and abdominal distention.  Genitourinary: Denies dysuria, urgency, frequency, hematuria, flank pain and difficulty urinating.  Endocrine: Denies: hot or cold intolerance, sweats, changes in hair or nails, polyuria, polydipsia. Musculoskeletal: Denies myalgias, back pain, joint swelling, arthralgias and gait problem.  Skin: Denies pallor, rash and wound.  Neurological: Denies dizziness, seizures, syncope, weakness, light-headedness, numbness and headaches.  Hematological: Denies adenopathy. Easy bruising, personal or family bleeding history  Psychiatric/Behavioral: Denies suicidal ideation, mood changes, confusion, nervousness, sleep disturbance and agitation    Physical Exam: Vitals:   09/21/22 1052 09/21/22 1112  BP: (!) 128/96 (!) 130/90  Pulse: 100   Temp: 98 F (36.7 C)   TempSrc: Oral   SpO2: 99%   Weight: 199 lb 12.8 oz (90.6 kg)   Height: 5\' 8"  (1.727 m)     Body mass index is 30.38 kg/m.   Constitutional: NAD, calm, comfortable Eyes: PERRL, lids and conjunctivae normal ENMT: Mucous membranes are moist.  Respiratory: clear to auscultation bilaterally, no wheezing, no crackles. Normal respiratory effort. No accessory muscle use.  Cardiovascular: Regular rate and rhythm, no murmurs / rubs / gallops.  No extremity edema. Psychiatric: Normal judgment and insight. Alert and oriented x 3. Normal mood.    Impression and Plan:  Primary hypertension - Plan: valsartan-hydrochlorothiazide (DIOVAN-HCT) 160-25 MG tablet, amLODipine (NORVASC) 5 MG  tablet  -Blood pressure is not well controlled on amlodipine alone.  Add Diovan HCT in addition to amlodipine and follow-up in 6 to 8 weeks. -She has declined COVID and flu vaccines despite counseling.  Time spent:30 minutes reviewing chart, interviewing and examining patient and formulating plan of care.       Chaya Jan, MD [facility] at [facility]

## 2023-01-24 ENCOUNTER — Other Ambulatory Visit: Payer: Self-pay | Admitting: Internal Medicine

## 2023-01-24 ENCOUNTER — Telehealth: Payer: Self-pay | Admitting: Internal Medicine

## 2023-01-24 DIAGNOSIS — I10 Essential (primary) hypertension: Secondary | ICD-10-CM

## 2023-01-24 MED ORDER — VALSARTAN-HYDROCHLOROTHIAZIDE 160-25 MG PO TABS: 1.0000 | ORAL_TABLET | Freq: Every day | ORAL | 1 refills | Status: DC

## 2023-01-24 NOTE — Telephone Encounter (Signed)
Patient is aware Refill sent Follow up scheduled

## 2023-01-24 NOTE — Telephone Encounter (Signed)
Pt states she currently has no insurance, but has completely run out of the: valsartan-hydrochlorothiazide (DIOVAN-HCT) 160-25 MG tablet   Pt is wondering if she could just take the  amLODipine (NORVASC) 5 MG tablet  until her new insurance kicks in on  02/09/23 - Pt states she still has 30 days worth left of that.  Pt added that if MD thinks she should not be without the valsartan, then she will figure out where to get the money to pay for the refill.  Pt is asking for a call back to discuss.   LOV: 09/21/22  Please advise.   [facility] DR[facility] STORE [place], [place] AT [facility] OF [place] Phone: [phone]  Fax: [phone]

## 2023-02-22 ENCOUNTER — Ambulatory Visit: Payer: Managed Care, Other (non HMO) | Admitting: Internal Medicine

## 2023-02-27 IMAGING — US US PELVIS COMPLETE WITH TRANSVAGINAL
1 series · 15 of 25 positions shown · non-contrast
Comparison: None

CLINICAL DATA: Dysfunctional uterine bleeding.



[Series 1: us pelvis complete mc & wl · 15 of 96 slices shown]
[im 1/96]
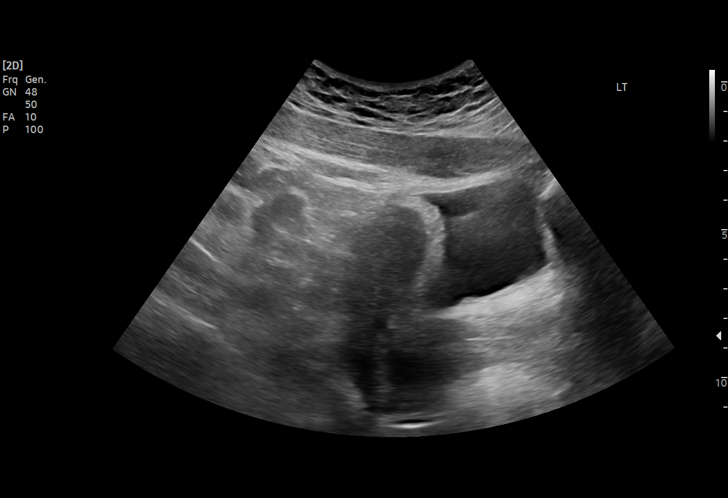
[im 8/96]
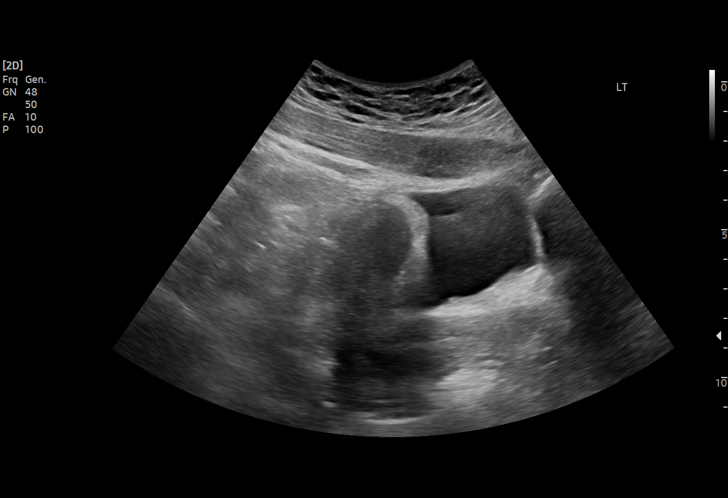
[im 16/96]
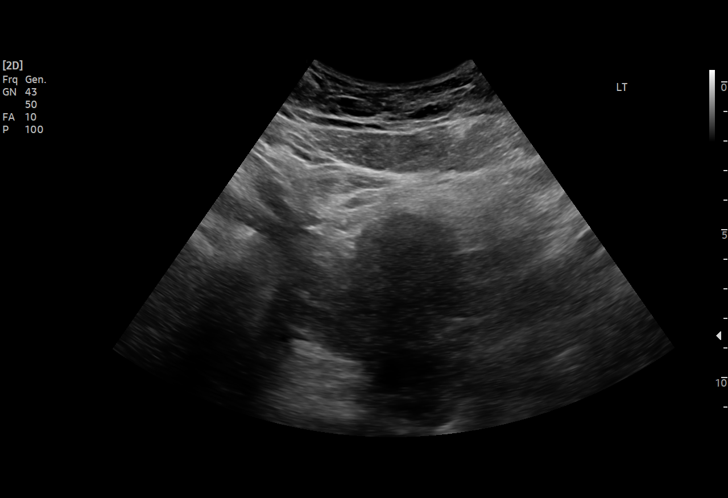
[im 20/96]
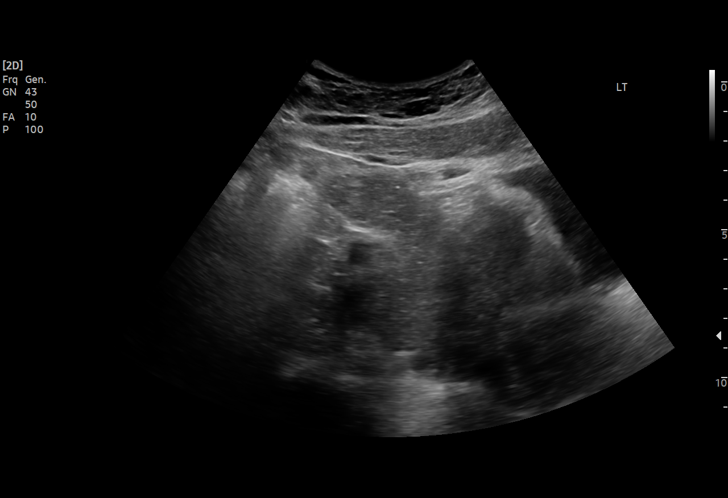
[im 28/96]
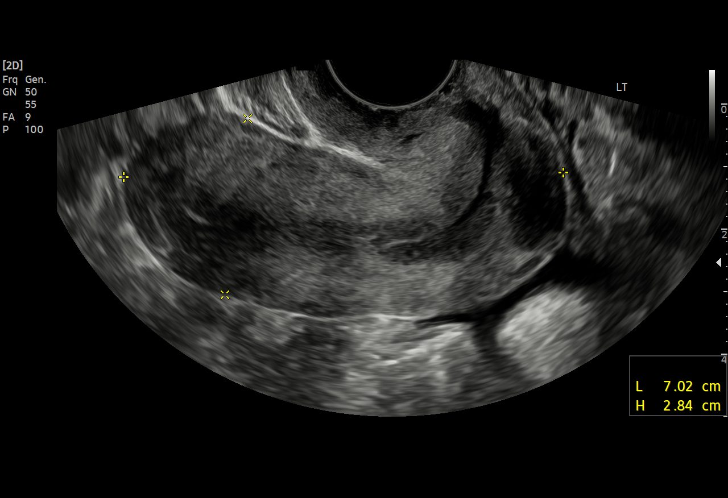
[im 36/96]
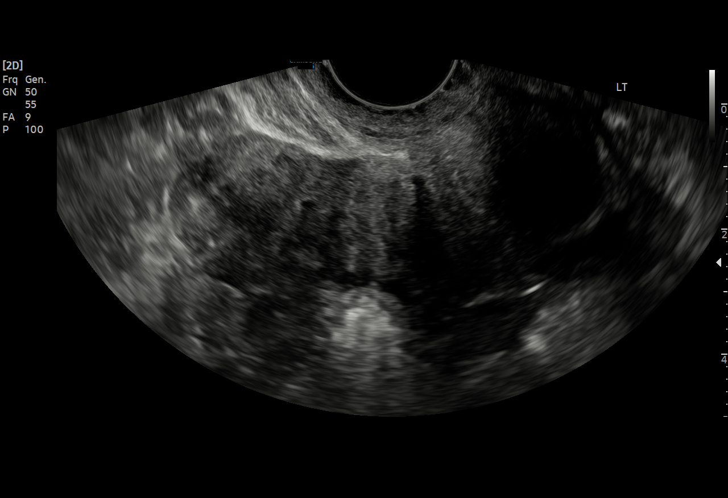
[im 40/96]
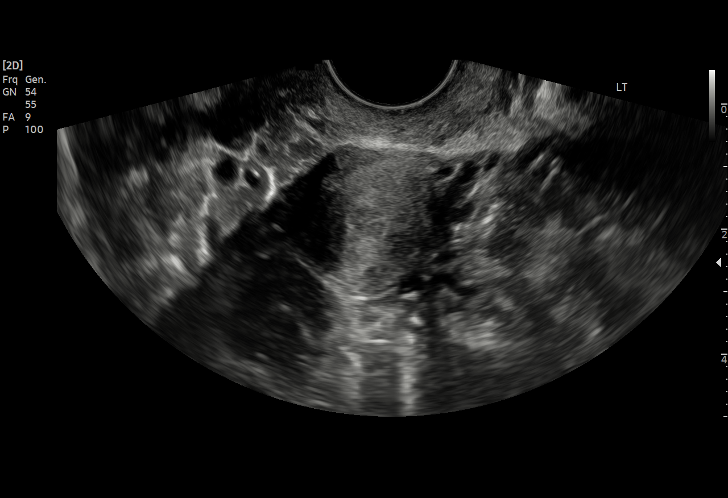
[im 48/96]
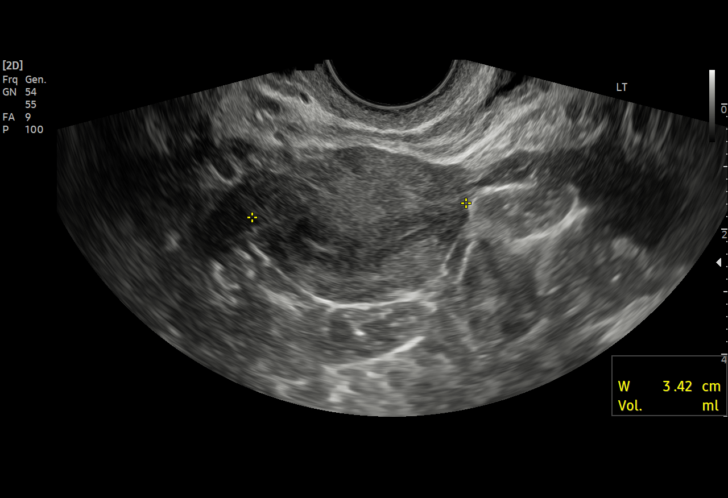
[im 56/96]
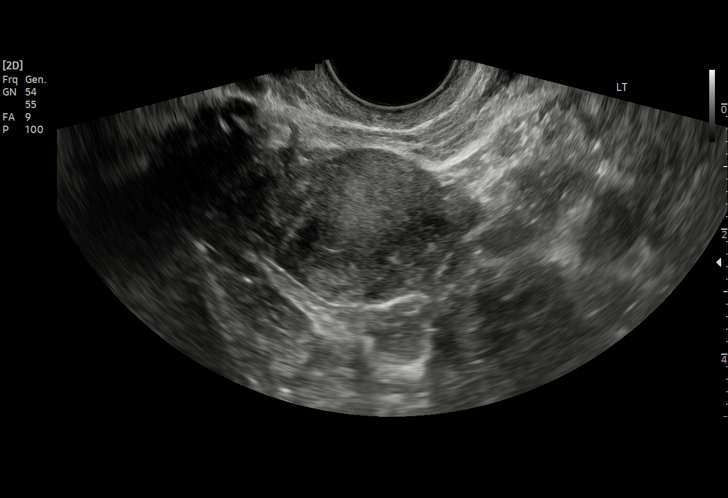
[im 60/96]
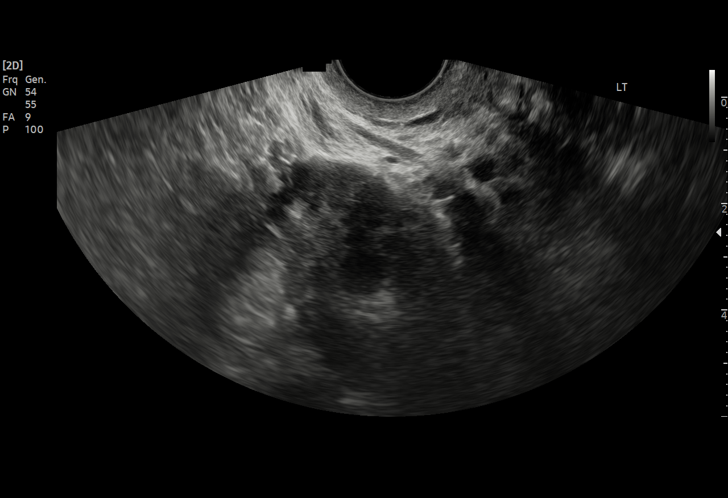
[im 68/96]
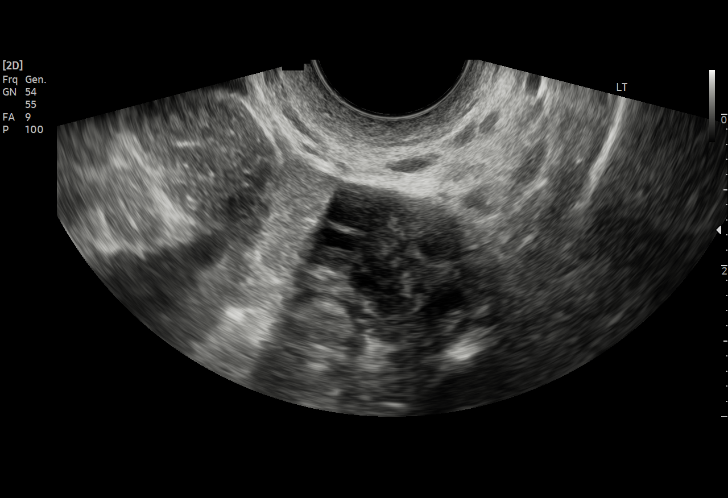
[im 76/96]
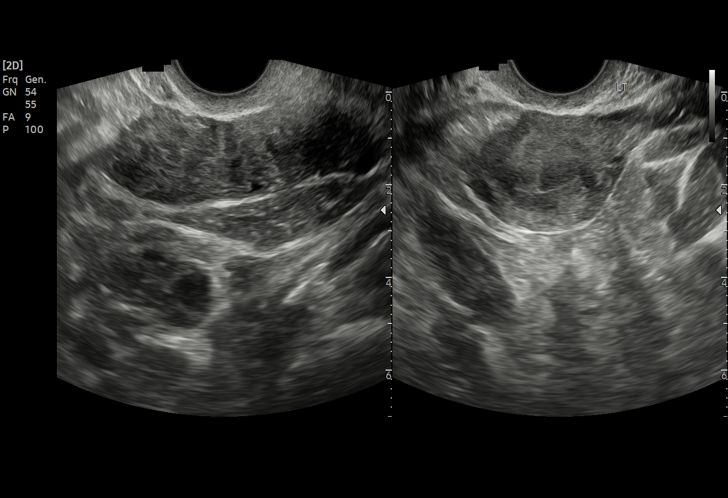
[im 80/96]
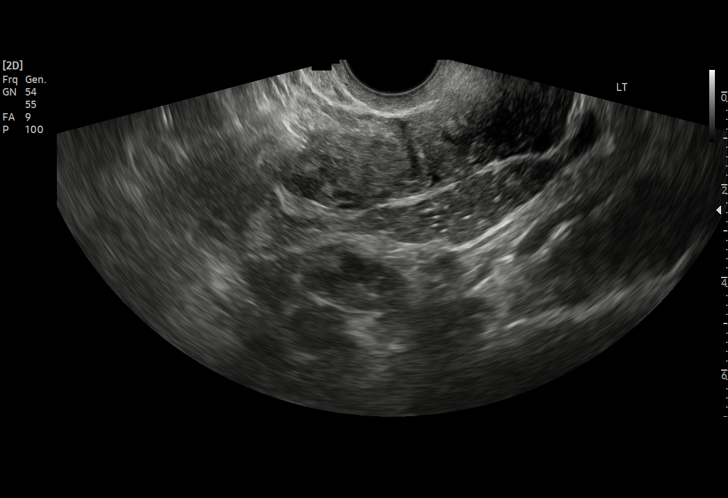
[im 88/96]
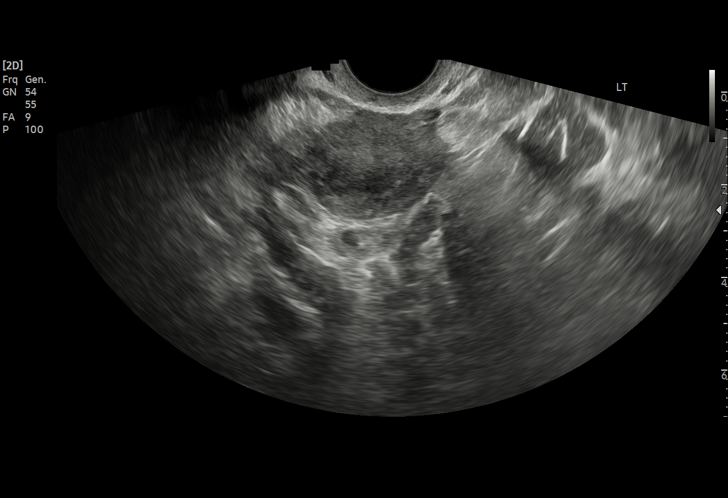
[im 96/96]
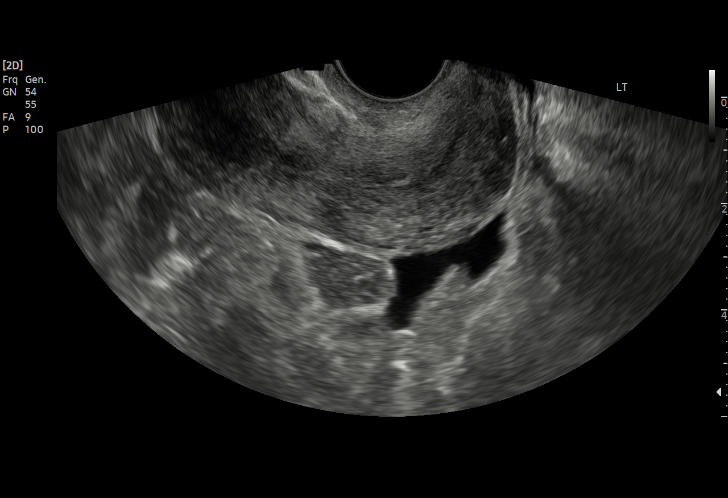

[15 of 25 positions shown; findings below may reference images not displayed]

FINDINGS: Uterus

Measurements: 7 x 2.8 x 3.4 cm = volume: 35.7 mL. No fibroids or
other mass visualized.

Endometrium

Thickness: 1.7 mm.  No focal abnormality visualized.

Right ovary

Measurements: 2.4 x 1.5 x 1.1 cm = volume: 2.1 mL. Normal
appearance/no adnexal mass.

Left ovary

Measurements: 3 x 2.1 x 2 cm = volume: 6.6 mL. Normal appearance/no
adnexal mass.

Other findings

Small amount of pelvic free fluid likely physiologic.
IMPRESSION: 1. Normal pelvic ultrasound. If bleeding remains unresponsive to
hormonal or medical therapy, sonohysterogram should be considered
for focal lesion work-up. (Ref: Radiological Reasoning: Algorithmic
Workup of Abnormal Vaginal Bleeding with Endovaginal Sonography and
Sonohysterography. AJR [id number]; 191:S68-73)

## 2023-03-01 ENCOUNTER — Ambulatory Visit: Payer: Managed Care, Other (non HMO) | Admitting: Internal Medicine

## 2023-03-02 ENCOUNTER — Ambulatory Visit (INDEPENDENT_AMBULATORY_CARE_PROVIDER_SITE_OTHER): Payer: BC Managed Care – PPO | Admitting: Adult Health

## 2023-03-02 ENCOUNTER — Encounter: Payer: Self-pay | Admitting: Adult Health

## 2023-03-02 VITALS — BP 110/88 | HR 80 | Temp 97.9°F | Ht 68.0 in | Wt 203.0 lb

## 2023-03-02 DIAGNOSIS — I10 Essential (primary) hypertension: Secondary | ICD-10-CM | POA: Diagnosis not present

## 2023-03-02 DIAGNOSIS — N926 Irregular menstruation, unspecified: Secondary | ICD-10-CM

## 2023-03-02 LAB — POCT URINE PREGNANCY: Preg Test, Ur: NEGATIVE

## 2023-03-02 MED ORDER — AMLODIPINE BESYLATE 5 MG PO TABS: 5.0000 mg | ORAL_TABLET | Freq: Every day | ORAL | 0 refills | Status: DC

## 2023-03-02 MED ORDER — VALSARTAN-HYDROCHLOROTHIAZIDE 160-25 MG PO TABS: 1.0000 | ORAL_TABLET | Freq: Every day | ORAL | 0 refills | Status: DC

## 2023-03-02 NOTE — Progress Notes (Signed)
Subjective:    Patient ID: Emily Horne, female    DOB: 02-02-1991, 32 y.o.   MRN: [id number]  HPI 32 year old female who  has a past medical history of Allergy, Chlamydia, Herpes simplex without mention of complication, and UTI (urinary tract infection).  She is a patient of Dr. Jerilee Hoh that I am seeing today for the first time for follow up regarding HTN   Last seen by her PCP out 5 months ago at which time her blood pressure was not controlled with Norvasc 5 mg.  Started on valsartan/hydrochlorothiazide 160-25 mg in addition to amlodipine 5 mg. She denies dizziness, lightheadedness, blurred vision, or syncope.   BP Readings from Last 3 Encounters:  03/02/23 110/88  09/21/22 (!) 130/90  06/21/22 124/82   Furthermore she is also about a month late on her period. She got off her birth control about a year ago, she usually has a regular period. She denies nausea or vomiting. She has had unprotected sex about two weeks ago. She is not trying to get pregnant. She took a home pregnancy test.   Review of Systems See HPI   Past Medical History:  Diagnosis Date   Allergy    Chlamydia    Herpes simplex without mention of complication    UTI (urinary tract infection)     Social History   Socioeconomic History   Marital status: Single    Spouse name: Not on file   Number of children: Not on file   Years of education: Not on file   Highest education level: Not on file  Occupational History   Not on file  Tobacco Use   Smoking status: Never   Smokeless tobacco: Never  Substance and Sexual Activity   Alcohol use: Yes    Comment: socialy   Drug use: Yes    Types: Marijuana   Sexual activity: Yes    Birth control/protection: Implant  Other Topics Concern   Not on file  Social History Narrative   Not on file   Social Determinants of Health   Financial Resource Strain: Not on file  Food Insecurity: Not on file  Transportation Needs: Not on file  Physical Activity:  Not on file  Stress: Not on file  Social Connections: Not on file  Intimate Partner Violence: Not on file    Past Surgical History:  Procedure Laterality Date   NO PAST SURGERIES      Family History  Problem Relation Age of Onset   Diabetes Father    Asthma Father    Migraines Mother    Colon cancer Maternal Grandfather     Allergies  Allergen Reactions   Bactrim [Sulfamethoxazole-Trimethoprim] Other (See Comments)    Cold sweats all night   Sulfamethoxazole-Trimethoprim Nausea Only    Current Outpatient Medications on File Prior to Visit  Medication Sig Dispense Refill   amLODipine (NORVASC) 5 MG tablet Take 1 tablet (5 mg total) by mouth daily. 90 tablet 1   lidocaine (XYLOCAINE) 2 % solution Use as directed 15 mLs in the mouth or throat as needed for mouth pain. 100 mL 0   ondansetron (ZOFRAN-ODT) 4 MG disintegrating tablet Take 1 tablet (4 mg total) by mouth every 8 (eight) hours as needed for nausea or vomiting. 20 tablet 0   valsartan-hydrochlorothiazide (DIOVAN-HCT) 160-25 MG tablet TAKE 1 TABLET BY MOUTH DAILY 30 tablet 1   No current facility-administered medications on file prior to visit.    BP 110/88   Pulse  80   Temp 97.9 F (36.6 C) (Oral)   Ht 5\' 8"  (1.727 m)   Wt 203 lb (92.1 kg)   LMP 01/08/2023 Comment: implant removed 01/2022  SpO2 100%   BMI 30.87 kg/m       Objective:   Physical Exam Vitals and nursing note reviewed.  Constitutional:      Appearance: Normal appearance.  Cardiovascular:     Rate and Rhythm: Normal rate and regular rhythm.     Pulses: Normal pulses.     Heart sounds: Normal heart sounds.  Pulmonary:     Effort: Pulmonary effort is normal.     Breath sounds: Normal breath sounds.  Musculoskeletal:        General: Normal range of motion.  Skin:    General: Skin is warm and dry.  Neurological:     General: No focal deficit present.     Mental Status: She is alert and oriented to person, place, and time.  Psychiatric:         Mood and Affect: Mood normal.        Behavior: Behavior normal.        Thought Content: Thought content normal.        Judgment: Judgment normal.        Assessment & Plan:  1. Primary hypertension  - amLODipine (NORVASC) 5 MG tablet; Take 1 tablet (5 mg total) by mouth daily.  Dispense: 90 tablet; Refill: 0 - valsartan-hydrochlorothiazide (DIOVAN-HCT) 160-25 MG tablet; Take 1 tablet by mouth daily.  Dispense: 90 tablet; Refill: 0 - Basic Metabolic Panel; Future  2. Missed period  - POCT urine pregnancy- negative - Will check HCG quant to make sure.  - HCG, Quant, Pregnancy; Future  Dorothyann Peng, NP  Time spent with patient today was 31 minutes which consisted of chart review, discussing diagnosis, work up, treatment answering questions and documentation.

## 2023-03-03 LAB — BASIC METABOLIC PANEL
BUN: 7 mg/dL (ref 7–25)
CO2: 25 mmol/L (ref 20–32)
Calcium: 9.2 mg/dL (ref 8.6–10.2)
Chloride: 102 mmol/L (ref 98–110)
Creat: 0.64 mg/dL (ref 0.50–0.96)
Glucose, Bld: 92 mg/dL (ref 65–99)
Potassium: 3.5 mmol/L (ref 3.5–5.3)
Sodium: 139 mmol/L (ref 135–146)

## 2023-03-03 LAB — HCG, QUANTITATIVE, PREGNANCY: HCG, Total, QN: <5 m[IU]/mL

## 2023-04-10 ENCOUNTER — Telehealth: Payer: Self-pay | Admitting: Internal Medicine

## 2023-04-10 DIAGNOSIS — I10 Essential (primary) hypertension: Secondary | ICD-10-CM

## 2023-04-10 MED ORDER — VALSARTAN-HYDROCHLOROTHIAZIDE 160-25 MG PO TABS: 1.0000 | ORAL_TABLET | Freq: Every day | ORAL | 0 refills | Status: DC

## 2023-04-10 NOTE — Telephone Encounter (Signed)
Refill sent.

## 2023-04-10 NOTE — Telephone Encounter (Signed)
Prescription Request  04/10/2023  LOV: 09/21/2022  What is the name of the medication or equipment? valsartan-hydrochlorothiazide (DIOVAN-HCT) 160-25 MG tablet   Have you contacted your pharmacy to request a refill? No   Which pharmacy would you like this sent to?  [facility] #[id number] [place], [place] - [address] AT [facility] OF [address] Phone: [phone]  Fax: [phone]  Pt stated she want a 90 day supply.  Patient notified that their request is being sent to the clinical staff for review and that they should receive a response within 2 business days.   Please advise at Mobile [phone] (mobile)

## 2023-05-16 ENCOUNTER — Encounter: Payer: Self-pay | Admitting: Family Medicine

## 2023-05-16 ENCOUNTER — Ambulatory Visit (INDEPENDENT_AMBULATORY_CARE_PROVIDER_SITE_OTHER): Payer: BC Managed Care – PPO | Admitting: Family Medicine

## 2023-05-16 VITALS — BP 116/80 | HR 123 | Temp 98.8°F | Ht 68.0 in | Wt 197.4 lb

## 2023-05-16 DIAGNOSIS — R000 Tachycardia, unspecified: Secondary | ICD-10-CM | POA: Diagnosis not present

## 2023-05-16 DIAGNOSIS — J020 Streptococcal pharyngitis: Secondary | ICD-10-CM | POA: Diagnosis not present

## 2023-05-16 MED ORDER — AMOXICILLIN 250 MG/5ML PO SUSR: 500.0000 mg | Freq: Three times a day (TID) | ORAL | 0 refills | Status: AC

## 2023-05-16 MED ORDER — ONDANSETRON HCL 4 MG PO TABS: 4.0000 mg | ORAL_TABLET | Freq: Three times a day (TID) | ORAL | 0 refills | Status: DC | PRN

## 2023-05-16 NOTE — Progress Notes (Signed)
Established Patient Office Visit   Subjective:  Patient ID: Emily Horne, female    DOB: [birth year]  Age: 32 y.o. MRN: [id number]  Chief Complaint  Patient presents with   Sore Throat    Sore throat, low grade temp last night, nauseous symptoms started yesterday.     Sore Throat  Associated symptoms include headaches. Pertinent negatives include no abdominal pain.   Encounter Diagnoses  Name Primary?   Strep pharyngitis Yes   1 day history of headache with elevated temperature, sore throat, nausea and little cough or congestion.  History of strep.   Review of Systems  Constitutional: Negative.   HENT:  Positive for sore throat.   Eyes:  Negative for blurred vision, discharge and redness.  Respiratory: Negative.    Cardiovascular: Negative.   Gastrointestinal:  Positive for nausea. Negative for abdominal pain.  Genitourinary: Negative.   Musculoskeletal: Negative.  Negative for myalgias.  Skin:  Negative for rash.  Neurological:  Positive for headaches. Negative for tingling, loss of consciousness and weakness.  Endo/Heme/Allergies:  Negative for polydipsia.     Current Outpatient Medications:    amLODipine (NORVASC) 5 MG tablet, Take 1 tablet (5 mg total) by mouth daily., Disp: 90 tablet, Rfl: 0   amoxicillin (AMOXIL) 250 MG/5ML suspension, Take 10 mLs (500 mg total) by mouth 3 (three) times daily for 10 days., Disp: 300 mL, Rfl: 0   lidocaine (XYLOCAINE) 2 % solution, Use as directed 15 mLs in the mouth or throat as needed for mouth pain., Disp: 100 mL, Rfl: 0   ondansetron (ZOFRAN) 4 MG tablet, Take 1 tablet (4 mg total) by mouth every 8 (eight) hours as needed for nausea or vomiting., Disp: 20 tablet, Rfl: 0   valsartan-hydrochlorothiazide (DIOVAN-HCT) 160-25 MG tablet, Take 1 tablet by mouth daily., Disp: 90 tablet, Rfl: 0   ondansetron (ZOFRAN-ODT) 4 MG disintegrating tablet, Take 1 tablet (4 mg total) by mouth every 8 (eight) hours as needed for nausea or  vomiting. (Patient not taking: Reported on 05/16/2023), Disp: 20 tablet, Rfl: 0   Objective:     BP 116/80 (BP Location: Right Arm, Patient Position: Sitting, Cuff Size: Normal)   Pulse (!) 123   Temp 98.8 F (37.1 C) (Temporal)   Ht 5\' 8"  (1.727 m)   Wt 197 lb 6.4 oz (89.5 kg)   BMI 30.01 kg/m    Physical Exam Constitutional:      General: She is not in acute distress.    Appearance: Normal appearance. She is not ill-appearing, toxic-appearing or diaphoretic.  HENT:     Head: Normocephalic and atraumatic.     Right Ear: External ear normal.     Left Ear: External ear normal.     Mouth/Throat:     Pharynx: Oropharyngeal exudate and posterior oropharyngeal erythema present.      Comments: Crypts.  Eyes:     General: No scleral icterus.       Right eye: No discharge.        Left eye: No discharge.     Extraocular Movements: Extraocular movements intact.     Conjunctiva/sclera: Conjunctivae normal.  Cardiovascular:     Rate and Rhythm: Regular rhythm. Tachycardia present.  Pulmonary:     Effort: Pulmonary effort is normal. No respiratory distress.  Lymphadenopathy:     Cervical: Cervical adenopathy present.     Right cervical: Superficial cervical adenopathy present.     Left cervical: Superficial cervical adenopathy present.  Skin:    General:  Skin is warm and dry.  Neurological:     Mental Status: She is alert and oriented to person, place, and time.  Psychiatric:        Mood and Affect: Mood normal.        Behavior: Behavior normal.      No results found for any visits on 05/16/23.    The ASCVD Risk score (Arnett DK, et al., 2019) failed to calculate for the following reasons:   The 2019 ASCVD risk score is only valid for ages 106 to 98    Assessment & Plan:   Strep pharyngitis -     Amoxicillin; Take 10 mLs (500 mg total) by mouth 3 (three) times daily for 10 days.  Dispense: 300 mL; Refill: 0 -     Ondansetron HCl; Take 1 tablet (4 mg total) by mouth  every 8 (eight) hours as needed for nausea or vomiting.  Dispense: 20 tablet; Refill: 0    Return Follow-up in 3 days if not improving..  Follow-up with primary MD for elevated heart rate.  Likely secondary to above infection. Strep by criteria with a positive rapid test.  Out of work through tomorrow.  Mliss Sax, MD

## 2023-05-29 ENCOUNTER — Encounter: Payer: Self-pay | Admitting: Family Medicine

## 2023-05-29 ENCOUNTER — Ambulatory Visit (INDEPENDENT_AMBULATORY_CARE_PROVIDER_SITE_OTHER): Payer: BC Managed Care – PPO | Admitting: Family Medicine

## 2023-05-29 VITALS — BP 132/94 | HR 91 | Temp 97.8°F | Wt 199.0 lb

## 2023-05-29 DIAGNOSIS — J020 Streptococcal pharyngitis: Secondary | ICD-10-CM

## 2023-05-29 DIAGNOSIS — J029 Acute pharyngitis, unspecified: Secondary | ICD-10-CM

## 2023-05-29 LAB — POCT RAPID STREP A (OFFICE): Rapid Strep A Screen: POSITIVE — AB

## 2023-05-29 MED ORDER — CEFUROXIME AXETIL 500 MG PO TABS: 500.0000 mg | ORAL_TABLET | Freq: Two times a day (BID) | ORAL | 0 refills | Status: AC

## 2023-05-29 NOTE — Progress Notes (Signed)
   Subjective:    Patient ID: Emily Horne, female    DOB: [birth year], 32 y.o.   MRN: [id number]  HPI Here for a recurrent ST. She has a hx of frequent strep throat infections, about oncr a year. She was seen on 05-16-23 for classic symptoms of ST, swollen tonsils, enlarged AC nodes, and fever. No testing was done, but she was treated with Amoxicillin. She started to feel better for a few days, but the symptoms returned.    Review of Systems  Constitutional: Negative.   HENT:  Positive for sore throat. Negative for congestion, ear pain, postnasal drip, sinus pressure, trouble swallowing and voice change.   Eyes: Negative.   Respiratory: Negative.         Objective:   Physical Exam Constitutional:      General: She is not in acute distress.    Appearance: Normal appearance.  HENT:     Right Ear: Tympanic membrane, ear canal and external ear normal.     Left Ear: Tympanic membrane, ear canal and external ear normal.     Nose: Nose normal.     Mouth/Throat:     Pharynx: Oropharyngeal exudate and posterior oropharyngeal erythema present.  Eyes:     Conjunctiva/sclera: Conjunctivae normal.  Pulmonary:     Effort: Pulmonary effort is normal.     Breath sounds: Normal breath sounds.  Lymphadenopathy:     Cervical: Cervical adenopathy present.  Neurological:     Mental Status: She is alert.           Assessment & Plan:  Recurrent strep pharyngitis. We will treat this with 10 days of Cefuroxime. We will also refer her to ENT to consider a tonsillectomy.  Gershon Crane, MD

## 2023-07-07 ENCOUNTER — Other Ambulatory Visit: Payer: Self-pay | Admitting: Internal Medicine

## 2023-07-07 DIAGNOSIS — I10 Essential (primary) hypertension: Secondary | ICD-10-CM

## 2023-07-14 ENCOUNTER — Other Ambulatory Visit: Payer: Self-pay | Admitting: Internal Medicine

## 2023-07-14 DIAGNOSIS — I10 Essential (primary) hypertension: Secondary | ICD-10-CM

## 2023-09-03 ENCOUNTER — Ambulatory Visit (INDEPENDENT_AMBULATORY_CARE_PROVIDER_SITE_OTHER): Payer: Medicaid Other

## 2023-09-03 DIAGNOSIS — Z111 Encounter for screening for respiratory tuberculosis: Secondary | ICD-10-CM | POA: Diagnosis not present

## 2023-09-03 NOTE — Progress Notes (Signed)
PPD Placement note Artis Flock, 32 y.o. female is here today for placement of PPD test Reason for PPD test: work Pt taken PPD test before: yes Verified in allergy area and with patient that they are not allergic to the products PPD is made of (Phenol or Tween). Yes Is patient taking any oral or IV steroid medication now or have they taken it in the last month? no Has the patient ever received the BCG vaccine?: unknown Has the patient been in recent contact with anyone known or suspected of having active TB disease?: no      Date of exposure (if applicable): N/A      Name of person they were exposed to (if applicable): N/A Patient's Country of origin?: [place] O: Alert and oriented in NAD. P:  PPD placed on 09/03/2023 Right forearm   Patient advised to return for reading within 48-72 hours.

## 2023-09-05 ENCOUNTER — Telehealth: Payer: Self-pay | Admitting: Internal Medicine

## 2023-09-05 ENCOUNTER — Other Ambulatory Visit: Payer: Self-pay | Admitting: Internal Medicine

## 2023-09-05 DIAGNOSIS — I10 Essential (primary) hypertension: Secondary | ICD-10-CM

## 2023-09-05 MED ORDER — AMLODIPINE BESYLATE 5 MG PO TABS
5.0000 mg | ORAL_TABLET | Freq: Every day | ORAL | 0 refills | Status: DC
Start: 2023-09-05 — End: 2023-11-21

## 2023-09-05 MED ORDER — VALSARTAN-HYDROCHLOROTHIAZIDE 160-25 MG PO TABS
1.0000 | ORAL_TABLET | Freq: Every day | ORAL | 0 refills | Status: DC
Start: 2023-09-05 — End: 2023-11-21

## 2023-09-05 NOTE — Telephone Encounter (Signed)
Refill sent.

## 2023-09-05 NOTE — Addendum Note (Signed)
Addended by: Kern Reap B on: 09/05/2023 02:50 PM   Modules accepted: Orders

## 2023-09-05 NOTE — Telephone Encounter (Signed)
Prescription Request  09/05/2023  LOV: 09/21/2022  What is the name of the medication or equipment? valsartan-hydrochlorothiazide (DIOVAN-HCT) 160-25 MG tablet  amLODipine (NORVASC) 5 MG tablet  Have you contacted your pharmacy to request a refill? No   Which pharmacy would you like this sent to?  [id number] G[place] Otto, [place] - [address] AT [facility] [address] [place] [address] Phone: [phone] Fax: [phone]    Patient notified that their request is being sent to the clinical staff for review and that they should receive a response within 2 business days.   Please advise at Mobile [phone] (mobile)

## 2023-09-05 NOTE — Telephone Encounter (Signed)
Refill was resent. Patient will need an office visit with Dr Ardyth Harps for further refills.

## 2023-09-05 NOTE — Telephone Encounter (Signed)
Pt asking why the amLODipine (NORVASC) 5 MG tablet wasn't filled

## 2023-09-06 ENCOUNTER — Telehealth: Payer: Self-pay | Admitting: Internal Medicine

## 2023-09-06 ENCOUNTER — Ambulatory Visit: Payer: Medicaid Other | Admitting: *Deleted

## 2023-09-06 DIAGNOSIS — Z111 Encounter for screening for respiratory tuberculosis: Secondary | ICD-10-CM

## 2023-09-06 LAB — TB SKIN TEST
Induration: 0 mm
TB Skin Test: NEGATIVE

## 2023-09-06 NOTE — Telephone Encounter (Signed)
Health Medical Statement/TB screening and test results form to be filled out--placed in dr's folder.  Call pt upon completion for pickup.  Pt has had her TB test read--needs forms as soon as possible.

## 2023-09-06 NOTE — Telephone Encounter (Signed)
Patient is requesting to do a Transfer of Care appointment.  Is this ok with both providers?

## 2023-09-06 NOTE — Progress Notes (Signed)
PPD Reading Note  PPD read and results entered in EpicCare.  Result: 0 mm induration.  Interpretation: negative  If test not read within 48-72 hours of initial placement, patient advised to repeat in other arm 1-3 weeks after this test.  Allergic reaction: no

## 2023-09-10 NOTE — Telephone Encounter (Signed)
Calling to check on progress of this form being filled out. Says it is time sensitive. Aware that provider is OOO this week and she asks if another provider would assist.

## 2023-09-10 NOTE — Telephone Encounter (Signed)
Fine with me. BJ 

## 2023-09-11 NOTE — Telephone Encounter (Signed)
Form is complete and ready to be picked up.  Patient is aware.

## 2023-10-08 ENCOUNTER — Encounter: Payer: Medicaid Other | Admitting: Internal Medicine

## 2023-10-13 DIAGNOSIS — H1032 Unspecified acute conjunctivitis, left eye: Secondary | ICD-10-CM | POA: Diagnosis not present

## 2023-11-12 ENCOUNTER — Encounter: Payer: Medicaid Other | Admitting: Family Medicine

## 2023-11-21 ENCOUNTER — Encounter: Payer: Self-pay | Admitting: Internal Medicine

## 2023-11-21 ENCOUNTER — Telehealth (INDEPENDENT_AMBULATORY_CARE_PROVIDER_SITE_OTHER): Payer: BC Managed Care – PPO | Admitting: Internal Medicine

## 2023-11-21 VITALS — Wt 190.0 lb

## 2023-11-21 DIAGNOSIS — N62 Hypertrophy of breast: Secondary | ICD-10-CM | POA: Diagnosis not present

## 2023-11-21 DIAGNOSIS — I10 Essential (primary) hypertension: Secondary | ICD-10-CM | POA: Diagnosis not present

## 2023-11-21 MED ORDER — VALSARTAN-HYDROCHLOROTHIAZIDE 160-25 MG PO TABS: 1.0000 | ORAL_TABLET | Freq: Every day | ORAL | 0 refills | Status: DC

## 2023-11-21 MED ORDER — AMLODIPINE BESYLATE 5 MG PO TABS: 5.0000 mg | ORAL_TABLET | Freq: Every day | ORAL | 0 refills | Status: DC

## 2023-11-21 NOTE — Progress Notes (Signed)
 Per patient no change in vitals since last visit, unable to obtain new vitals due to telehealth visit

## 2023-11-21 NOTE — Progress Notes (Signed)
    Virtual Visit via Video Note  I connected with Emily Horne on 11/21/23 at 10:30 AM EST by a video enabled telemedicine application and verified that I am speaking with the correct person using two identifiers.  Location patient: home Location provider: work office Persons participating in the virtual visit: patient, provider  I discussed the limitations of evaluation and management by telemedicine and the availability of in person appointments. The patient expressed understanding and agreed to proceed.   HPI: She has scheduled this visit for medication refills.  She has a history of hypertension and is on amlodipine 5 mg and Diovan HCT 160/25 mg.  I have not seen her in a year.  Her medication refills were denied.  She is also requesting a plastic surgery referral due to large breast tissue causing back pain.   ROS: Negative unless indicated in HPI.  Past Medical History:  Diagnosis Date   Allergy    Chlamydia    Herpes simplex without mention of complication    UTI (urinary tract infection)     Past Surgical History:  Procedure Laterality Date   NO PAST SURGERIES      Family History  Problem Relation Age of Onset   Diabetes Father    Asthma Father    Migraines Mother    Colon cancer Maternal Grandfather     SOCIAL HX:   reports that she has never smoked. She has never used smokeless tobacco. She reports current alcohol use. She reports current drug use. Drug: Marijuana.   Current Outpatient Medications:    amLODipine (NORVASC) 5 MG tablet, Take 1 tablet (5 mg total) by mouth daily., Disp: 90 tablet, Rfl: 0   valsartan-hydrochlorothiazide (DIOVAN-HCT) 160-25 MG tablet, Take 1 tablet by mouth daily., Disp: 90 tablet, Rfl: 0  EXAM:   VITALS per patient if applicable: None reported  GENERAL: alert, oriented, appears well and in no acute distress  HEENT: atraumatic, conjunttiva clear, no obvious abnormalities on inspection of external nose and  ears  NECK: normal movements of the head and neck  LUNGS: on inspection no signs of respiratory distress, breathing rate appears normal, no obvious gross increased work of breathing, gasping or wheezing  CV: no obvious cyanosis  MS: moves all visible extremities without noticeable abnormality  PSYCH/NEURO: pleasant and cooperative, no obvious depression or anxiety, speech and thought processing grossly intact  ASSESSMENT AND PLAN:   Primary hypertension - Plan: valsartan-hydrochlorothiazide (DIOVAN-HCT) 160-25 MG tablet, amLODipine (NORVASC) 5 MG tablet  Large breasts - Plan: Ambulatory referral to Plastic Surgery  -Advised she will need appointment for in person evaluation to check blood pressure and lab work before further refills beyond these 90 days will be allowed.  She will call the office to schedule. -Plastic surgery referral placed.   I discussed the assessment and treatment plan with the patient. The patient was provided an opportunity to ask questions and all were answered. The patient agreed with the plan and demonstrated an understanding of the instructions.   The patient was advised to call back or seek an in-person evaluation if the symptoms worsen or if the condition fails to improve as anticipated.    Chaya Jan, MD  [facility]

## 2024-01-14 ENCOUNTER — Telehealth: Payer: Self-pay

## 2024-01-14 NOTE — Telephone Encounter (Signed)
Copied from CRM [id number]. Topic: Clinical - Request for Lab/Test Order >> Jan 14, 2024 11:11 AM Corin V wrote: Reason for CRM: Patient called requesting lab orders be placed so she can get her blood pressure meds refilled. Please order labs and call patient back to schedule.

## 2024-02-05 ENCOUNTER — Institutional Professional Consult (permissible substitution): Payer: Medicaid Other | Admitting: Plastic Surgery

## 2024-02-07 ENCOUNTER — Telehealth: Payer: Self-pay | Admitting: *Deleted

## 2024-02-07 DIAGNOSIS — I10 Essential (primary) hypertension: Secondary | ICD-10-CM

## 2024-02-07 NOTE — Telephone Encounter (Signed)
 Copied from CRM [id number]. Topic: Clinical - Request for Lab/Test Order >> Feb 07, 2024  9:58 AM Emily Horne wrote: Reason for CRM: Patient is requesting Dr. Ardyth Harps to order her lab work so that she can be scheduled to come in as she going to be running out of her medication soon.  *Please call patient to schedule lab work when the order is in*

## 2024-02-11 MED ORDER — VALSARTAN-HYDROCHLOROTHIAZIDE 160-25 MG PO TABS: 1.0000 | ORAL_TABLET | Freq: Every day | ORAL | 1 refills | Status: DC

## 2024-02-11 MED ORDER — AMLODIPINE BESYLATE 5 MG PO TABS: 5.0000 mg | ORAL_TABLET | Freq: Every day | ORAL | 1 refills | Status: DC

## 2024-02-11 NOTE — Telephone Encounter (Signed)
 Refill sent.

## 2024-02-11 NOTE — Telephone Encounter (Signed)
 Appointment scheduled for 05/12/24

## 2024-04-17 ENCOUNTER — Encounter (INDEPENDENT_AMBULATORY_CARE_PROVIDER_SITE_OTHER): Payer: Self-pay

## 2024-04-17 ENCOUNTER — Ambulatory Visit (INDEPENDENT_AMBULATORY_CARE_PROVIDER_SITE_OTHER): Admitting: Plastic Surgery

## 2024-04-17 VITALS — BP 109/78 | HR 92 | Ht 66.0 in | Wt 205.0 lb

## 2024-04-17 DIAGNOSIS — N62 Hypertrophy of breast: Secondary | ICD-10-CM | POA: Diagnosis not present

## 2024-04-17 DIAGNOSIS — Z6833 Body mass index (BMI) 33.0-33.9, adult: Secondary | ICD-10-CM

## 2024-04-17 DIAGNOSIS — Z803 Family history of malignant neoplasm of breast: Secondary | ICD-10-CM

## 2024-04-17 DIAGNOSIS — M542 Cervicalgia: Secondary | ICD-10-CM | POA: Diagnosis not present

## 2024-04-17 DIAGNOSIS — G8929 Other chronic pain: Secondary | ICD-10-CM

## 2024-04-17 DIAGNOSIS — M546 Pain in thoracic spine: Secondary | ICD-10-CM | POA: Diagnosis not present

## 2024-04-17 DIAGNOSIS — M545 Low back pain, unspecified: Secondary | ICD-10-CM

## 2024-04-17 DIAGNOSIS — M549 Dorsalgia, unspecified: Secondary | ICD-10-CM | POA: Insufficient documentation

## 2024-04-17 NOTE — Progress Notes (Signed)
 Patient ID: Emily Horne, female    DOB: 07-Nov-1991, 33 y.o.   MRN: [id number]   Chief Complaint  Patient presents with   Breast Problem   Advice Only    Mammary Hyperplasia: The patient is a 33 y.o. female with a history of mammary hyperplasia for several years.  She has extremely large breasts causing symptoms that include the following: Back pain in the upper and lower back, including neck pain. She pulls or pins her bra straps to provide better lift and relief of the pressure and pain. She notices relief by holding her breast up manually.  Her shoulder straps cause grooves and pain and pressure that requires padding for relief. Pain medication is sometimes required with motrin  and tylenol .  Activities that are hindered by enlarged breasts include: exercise and running.  She has tried supportive clothing as well as fitted bras without improvement.  Her breasts are extremely large and fairly symmetric.  She has hyperpigmentation of the inframammary area on both sides.  The sternal to nipple distance on the right is 41 cm and the left is 42 cm.  The IMF distance is 20 cm.  She is 5 feet 6 inches tall and weighs 205 pounds.  The BMI = 33.09 kg/m.  Preoperative bra size = DDD/E cup.  The estimated excess breast tissue to be removed at the time of surgery = 650 grams on the left and 650 grams on the right.  Mammogram history: none.  Family history of breast cancer:  positive on both sides.  Tobacco use:  none.   The patient expresses the desire to pursue surgical intervention.     Review of Systems  Constitutional: Negative.   HENT: Negative.    Eyes: Negative.   Respiratory: Negative.    Cardiovascular: Negative.   Gastrointestinal: Negative.   Endocrine: Negative.   Genitourinary: Negative.   Musculoskeletal:  Positive for back pain and neck pain.  Skin:  Positive for rash.    Past Medical History:  Diagnosis Date   Allergy    Chlamydia    Herpes simplex without mention of  complication    UTI (urinary tract infection)     Past Surgical History:  Procedure Laterality Date   NO PAST SURGERIES        Current Outpatient Medications:    amLODipine  (NORVASC ) 5 MG tablet, Take 1 tablet (5 mg total) by mouth daily., Disp: 90 tablet, Rfl: 1   valsartan -hydrochlorothiazide  (DIOVAN -HCT) 160-25 MG tablet, Take 1 tablet by mouth daily., Disp: 90 tablet, Rfl: 1   Objective:   Vitals:   04/17/24 1053  BP: 109/78  Pulse: 92  SpO2: 100%    Physical Exam Vitals and nursing note reviewed.  Constitutional:      Appearance: Normal appearance.  HENT:     Head: Atraumatic.  Cardiovascular:     Rate and Rhythm: Normal rate.     Pulses: Normal pulses.  Pulmonary:     Effort: Pulmonary effort is normal.  Abdominal:     General: There is no distension.     Palpations: Abdomen is soft.  Skin:    General: Skin is warm.     Capillary Refill: Capillary refill takes less than 2 seconds.  Neurological:     Mental Status: She is alert and oriented to person, place, and time.  Psychiatric:        Mood and Affect: Mood normal.        Behavior: Behavior normal.  Thought Content: Thought content normal.     Assessment & Plan:  Chronic bilateral thoracic back pain  Symptomatic mammary hypertrophy  The procedure the patient selected and that was best for the patient was discussed. The risk were discussed and include but not limited to the following:  Breast asymmetry, fluid accumulation, firmness of the breast, inability to breast feed, loss of nipple or areola, skin loss, change in skin and nipple sensation, fat necrosis of the breast tissue, bleeding, infection and healing delay.  There are risks of anesthesia and injury to nerves or blood vessels.  Allergic reaction to tape, suture and skin glue are possible.  There will be swelling.  Any of these can lead to the need for revisional surgery which is not included in this surgery.  A breast reduction has potential  to interfere with diagnostic procedures in the future.  This procedure is best done when the breast is fully developed.  Changes in the breast will continue to occur over time: pregnancy, weight gain or weigh loss. No guarantees are given for a certain bra or breast size.    Total time: 40 minutes. This includes time spent with the patient during the visit as well as time spent before and after the visit reviewing the chart, documenting the encounter, ordering pertinent studies and literature for the patient.     The patient is a good candidate for bilateral breast reduction with liposuction.  She wants to loss 20 more pounds.  She is going to work on that and see us  back in 3 months.    Pictures were obtained of the patient and placed in the chart with the patient's or guardian's permission.   Lindaann Requena Montrae Braithwaite, DO

## 2024-05-02 ENCOUNTER — Institutional Professional Consult (permissible substitution): Admitting: Plastic Surgery

## 2024-05-12 ENCOUNTER — Encounter: Payer: Self-pay | Admitting: Internal Medicine

## 2024-05-12 ENCOUNTER — Ambulatory Visit (INDEPENDENT_AMBULATORY_CARE_PROVIDER_SITE_OTHER): Admitting: Internal Medicine

## 2024-05-12 VITALS — BP 110/70 | HR 80 | Temp 98.2°F | Ht 67.5 in | Wt 208.0 lb

## 2024-05-12 DIAGNOSIS — Z114 Encounter for screening for human immunodeficiency virus [HIV]: Secondary | ICD-10-CM | POA: Diagnosis not present

## 2024-05-12 DIAGNOSIS — Z1159 Encounter for screening for other viral diseases: Secondary | ICD-10-CM

## 2024-05-12 DIAGNOSIS — Z0000 Encounter for general adult medical examination without abnormal findings: Secondary | ICD-10-CM

## 2024-05-12 DIAGNOSIS — I10 Essential (primary) hypertension: Secondary | ICD-10-CM | POA: Diagnosis not present

## 2024-05-12 LAB — CBC WITH DIFFERENTIAL/PLATELET
Basophils Absolute: 0 10*3/uL (ref 0.0–0.1)
Basophils Relative: 0.4 % (ref 0.0–3.0)
Eosinophils Absolute: 0.1 10*3/uL (ref 0.0–0.7)
Eosinophils Relative: 1.2 % (ref 0.0–5.0)
HCT: 41.8 % (ref 36.0–46.0)
Hemoglobin: 14.2 g/dL (ref 12.0–15.0)
Lymphocytes Relative: 32.7 % (ref 12.0–46.0)
Lymphs Abs: 3 10*3/uL (ref 0.7–4.0)
MCHC: 34 g/dL (ref 30.0–36.0)
MCV: 84.2 fl (ref 78.0–100.0)
Monocytes Absolute: 0.6 10*3/uL (ref 0.1–1.0)
Monocytes Relative: 6.2 % (ref 3.0–12.0)
Neutro Abs: 5.4 10*3/uL (ref 1.4–7.7)
Neutrophils Relative %: 59.5 % (ref 43.0–77.0)
Platelets: 388 10*3/uL (ref 150.0–400.0)
RBC: 4.96 Mil/uL (ref 3.87–5.11)
RDW: 14.4 % (ref 11.5–15.5)
WBC: 9.1 10*3/uL (ref 4.0–10.5)

## 2024-05-12 LAB — COMPREHENSIVE METABOLIC PANEL WITH GFR
ALT: 30 U/L (ref 0–35)
AST: 26 U/L (ref 0–37)
Albumin: 4.5 g/dL (ref 3.5–5.2)
Alkaline Phosphatase: 80 U/L (ref 39–117)
BUN: 9 mg/dL (ref 6–23)
CO2: 26 meq/L (ref 19–32)
Calcium: 9.8 mg/dL (ref 8.4–10.5)
Chloride: 97 meq/L (ref 96–112)
Creatinine, Ser: 0.67 mg/dL (ref 0.40–1.20)
GFR: 116.89 mL/min (ref >60.00)
Glucose, Bld: 92 mg/dL (ref 70–99)
Potassium: 2.9 meq/L — ABNORMAL LOW (ref 3.5–5.1)
Sodium: 132 meq/L — ABNORMAL LOW (ref 135–145)
Total Bilirubin: 0.4 mg/dL (ref 0.2–1.2)
Total Protein: 8.6 g/dL — ABNORMAL HIGH (ref 6.0–8.3)

## 2024-05-12 LAB — LIPID PANEL
Cholesterol: 178 mg/dL (ref 0–200)
HDL: 47.9 mg/dL (ref >39.00)
LDL Cholesterol: 112 mg/dL — ABNORMAL HIGH (ref 0–99)
NonHDL: 129.78
Total CHOL/HDL Ratio: 4
Triglycerides: 87 mg/dL (ref 0.0–149.0)
VLDL: 17.4 mg/dL (ref 0.0–40.0)

## 2024-05-12 MED ORDER — VALSARTAN-HYDROCHLOROTHIAZIDE 160-25 MG PO TABS: 1.0000 | ORAL_TABLET | Freq: Every day | ORAL | 1 refills | Status: AC

## 2024-05-12 MED ORDER — AMLODIPINE BESYLATE 5 MG PO TABS: 5.0000 mg | ORAL_TABLET | Freq: Every day | ORAL | 1 refills | Status: AC

## 2024-05-12 NOTE — Progress Notes (Signed)
     Established Patient Office Visit     CC/Reason for Visit: Annual preventive exam  HPI: Emily Horne is a 33 y.o. female who is coming in today for the above mentioned reasons. Past Medical History is significant for: Hypertension.  Needs medication refills.  Is doing well.  Will schedule follow-up with GYN.  Has routine eye and dental care.  Had a Tdap in 2020.   Past Medical/Surgical History: Past Medical History:  Diagnosis Date   Allergy    Chlamydia    Herpes simplex without mention of complication    UTI (urinary tract infection)     Past Surgical History:  Procedure Laterality Date   NO PAST SURGERIES      Social History:  reports that she has never smoked. She has never used smokeless tobacco. She reports current alcohol use. She reports current drug use. Drug: Marijuana.  Allergies: Allergies  Allergen Reactions   Bactrim  [Sulfamethoxazole -Trimethoprim ] Other (See Comments)    Cold sweats all night   Sulfamethoxazole -Trimethoprim  Nausea Only    Family History:  Family History  Problem Relation Age of Onset   Diabetes Father    Asthma Father    Migraines Mother    Colon cancer Maternal Grandfather      Current Outpatient Medications:    amLODipine  (NORVASC ) 5 MG tablet, Take 1 tablet (5 mg total) by mouth daily., Disp: 90 tablet, Rfl: 1   valsartan -hydrochlorothiazide  (DIOVAN -HCT) 160-25 MG tablet, Take 1 tablet by mouth daily., Disp: 90 tablet, Rfl: 1  Review of Systems:  Negative unless indicated in HPI.   Physical Exam: Vitals:   05/12/24 0931  BP: 110/70  Pulse: 80  Temp: 98.2 F (36.8 C)  TempSrc: Oral  SpO2: 99%  Weight: 208 lb (94.3 kg)  Height: 5' 7.5" (1.715 m)    Body mass index is 32.1 kg/m.   Physical Exam   Impression and Plan:  Encounter for preventive health examination  Primary hypertension -     amLODIPine  Besylate; Take 1 tablet (5 mg total) by mouth daily.  Dispense: 90 tablet; Refill: 1 -      Valsartan -hydroCHLOROthiazide ; Take 1 tablet by mouth daily.  Dispense: 90 tablet; Refill: 1 -     CBC with Differential/Platelet; Future -     Comprehensive metabolic panel with GFR; Future -     Lipid panel; Future  Encounter for hepatitis C screening test for low risk patient -     Hepatitis C antibody; Future  Encounter for screening for HIV -     HIV Antibody (routine testing w rflx); Future     -Recommend routine eye and dental care. -Healthy lifestyle discussed in detail. -Labs to be updated today. -Prostate cancer screening: Not applicable Health Maintenance  Topic Date Due   HIV Screening  Never done   Hepatitis C Screening  Never done   HPV Vaccine (2 - 3-dose SCDM series) 03/06/2022   Pap with HPV screening  06/19/2023   COVID-19 Vaccine (1 - 2024-25 season) Never done   Flu Shot  07/11/2024   DTaP/Tdap/Td vaccine (3 - Td or Tdap) 09/12/2029   Meningitis B Vaccine  Aged Out        Arizbeth Cawthorn Zilphia Hilt, MD [facility] at [facility]

## 2024-05-13 ENCOUNTER — Ambulatory Visit: Payer: Self-pay | Admitting: Internal Medicine

## 2024-05-13 DIAGNOSIS — E876 Hypokalemia: Secondary | ICD-10-CM

## 2024-05-13 LAB — HEPATITIS C ANTIBODY: Hepatitis C Ab: NONREACTIVE

## 2024-05-13 LAB — HIV ANTIBODY (ROUTINE TESTING W REFLEX): HIV 1&2 Ab, 4th Generation: NONREACTIVE

## 2024-05-14 MED ORDER — POTASSIUM CHLORIDE CRYS ER 20 MEQ PO TBCR: 20.0000 meq | EXTENDED_RELEASE_TABLET | Freq: Two times a day (BID) | ORAL | 0 refills | Status: DC

## 2024-05-28 ENCOUNTER — Other Ambulatory Visit

## 2024-06-09 ENCOUNTER — Encounter (INDEPENDENT_AMBULATORY_CARE_PROVIDER_SITE_OTHER): Payer: Self-pay

## 2024-06-10 ENCOUNTER — Other Ambulatory Visit

## 2024-08-19 ENCOUNTER — Ambulatory Visit: Admitting: Plastic Surgery

## 2025-01-06 ENCOUNTER — Emergency Department (HOSPITAL_COMMUNITY)
Admission: EM | Admit: 2025-01-06 | Discharge: 2025-01-06 | Disposition: A | Discharge status: Home / Self Care | Attending: Emergency Medicine | Admitting: Emergency Medicine

## 2025-01-06 ENCOUNTER — Other Ambulatory Visit: Payer: Self-pay

## 2025-01-06 ENCOUNTER — Emergency Department (HOSPITAL_COMMUNITY)

## 2025-01-06 DIAGNOSIS — J111 Influenza due to unidentified influenza virus with other respiratory manifestations: Secondary | ICD-10-CM | POA: Diagnosis not present

## 2025-01-06 DIAGNOSIS — R509 Fever, unspecified: Secondary | ICD-10-CM | POA: Diagnosis present

## 2025-01-06 DIAGNOSIS — Z7982 Long term (current) use of aspirin: Secondary | ICD-10-CM | POA: Diagnosis not present

## 2025-01-06 LAB — RESP PANEL BY RT-PCR (RSV, FLU A&B, COVID)  RVPGX2
Influenza A by PCR: POSITIVE — AB
Influenza B by PCR: NEGATIVE
Resp Syncytial Virus by PCR: NEGATIVE
SARS Coronavirus 2 by RT PCR: NEGATIVE

## 2025-01-06 MED ORDER — ACETAMINOPHEN 325 MG PO TABS
975.0000 mg | ORAL_TABLET | Freq: Once | ORAL | Status: AC
  Administered 2025-01-06: 975 mg via ORAL
  Filled 2025-01-06: qty 3

## 2025-01-06 NOTE — ED Triage Notes (Signed)
 Pt. Stated, I was exposed to RSV from my nepew, and Ive had a fever, headache, and bodyaches

## 2025-01-06 NOTE — ED Provider Notes (Signed)
 " [facility] AT [facility] Provider Note   CSN: [id number] Arrival date & time: 01/06/25  9068     Patient presents with: Fever, Headache, and bodyaches   RONDALYN BELFORD is a 34 y.o. female past medical history that is overall noncontributory who presents concern for fever, headache, body ache.  Exposed to nephew who has RSV.  Denies any nausea, vomiting.    Fever Associated symptoms: headaches   Headache Associated symptoms: fever        Prior to Admission medications  Medication Sig Start Date End Date Taking? Authorizing Provider  acetaminophen  (TYLENOL ) 500 MG tablet Take 1,000 mg by mouth every 4 (four) hours as needed for mild pain (pain score 1-3) or fever.   Yes [provider]  amLODipine  (NORVASC ) 5 MG tablet Take 1 tablet (5 mg total) by mouth daily. 05/12/24  Yes Theophilus Andrews, Tully GRADE, MD  aspirin-sod bicarb-citric acid (ALKA-SELTZER) 325 MG TBEF tablet Take 325 mg by mouth every 6 (six) hours as needed (fever).   Yes [provider]  DM-Doxylamine-Acetaminophen  (NYQUIL HBP COLD & FLU) 15-6.25-325 MG/15ML LIQD Take 15 mLs by mouth every 4 (four) hours as needed (cold and flu).   Yes [provider]  Pseudoephedrine-APAP-DM (DAYQUIL PO) Take 15 mLs by mouth every 4 (four) hours as needed (cold and flu).   Yes [provider]  valsartan -hydrochlorothiazide  (DIOVAN -HCT) 160-25 MG tablet Take 1 tablet by mouth daily. 05/12/24  Yes Theophilus Andrews, Tully GRADE, MD    Allergies: Bactrim  [sulfamethoxazole -trimethoprim ] and Sulfamethoxazole -trimethoprim     Review of Systems  Constitutional:  Positive for fever.  Neurological:  Positive for headaches.  All other systems reviewed and are negative.   Updated Vital Signs BP 125/84 (BP Location: Right Arm)   Pulse (!) 114   Temp (!) 100.7 F (38.2 C)   Resp 19   Ht 5' 6 (1.676 m)   Wt 86.2 kg   SpO2 97%   BMI 30.67 kg/m   Physical Exam Vitals and  nursing note reviewed.  Constitutional:      General: She is not in acute distress.    Appearance: Normal appearance.  HENT:     Head: Normocephalic and atraumatic.     Comments: No significant posterior oropharynx erythema, swelling, exudate. Uvula midline, tonsils 1+ bilaterally.  No trismus, stridor, evidence of PTA, floor of mouth swelling or redness.   Eyes:     General:        Right eye: No discharge.        Left eye: No discharge.  Cardiovascular:     Rate and Rhythm: Normal rate and regular rhythm.     Heart sounds: No murmur heard.    No friction rub. No gallop.  Pulmonary:     Effort: Pulmonary effort is normal.     Breath sounds: Normal breath sounds.     Comments: No wheezing, rhonchi, stridor, rales Abdominal:     General: Bowel sounds are normal.     Palpations: Abdomen is soft.  Skin:    General: Skin is warm and dry.     Capillary Refill: Capillary refill takes less than 2 seconds.  Neurological:     Mental Status: She is alert and oriented to person, place, and time.  Psychiatric:        Mood and Affect: Mood normal.        Behavior: Behavior normal.     (all labs ordered are listed, but only abnormal results  are displayed) Labs Reviewed  RESP PANEL BY RT-PCR (RSV, FLU A&B, COVID)  RVPGX2 - Abnormal; Notable for the following components:      Result Value   Influenza A by PCR POSITIVE (*)    All other components within normal limits    EKG: None  Radiology: DG Chest Portable 1 View Result Date: 01/06/2025 CLINICAL DATA:  Shortness of breath EXAM: PORTABLE CHEST 1 VIEW COMPARISON:  07/27/2004 FINDINGS: The heart size and mediastinal contours are within normal limits. Both lungs are clear. The visualized skeletal structures are unremarkable. IMPRESSION: No active disease. Electronically Signed   By: CHRISTELLA.  Shick M.D.   On: 01/06/2025 11:12     Procedures   Medications Ordered in the ED  acetaminophen  (TYLENOL ) tablet 975 mg (975 mg Oral Given 01/06/25  0955)                                    Medical Decision Making Risk OTC drugs.   This is a well-appearing 34 yo female who presents with concern for 3 days of cough, fever, bodyaches, headache.  My emergent differential diagnosis includes acute upper respiratory infection with COVID, flu, RSV versus new asthma presentation, acute bronchitis, less clinical concern for pneumonia.  Also considered other ENT emergencies, Ludwig angina, strep pharyngitis, mono, versus epiglottis, tonsillitis versus other.  This is not an exhaustive differential.  On my exam patient is overall well-appearing, they have temperature of 100.7, improved on recheck, breathing unlabored, no tachypnea, no respiratory distress, stable oxygen saturation.  Patient with some tachycardia, 114, improved on recheck.  RVP independently reviewed by myself shows positive for flu A.  Patient symptoms are consistent with flu. I independently interpreted imaging including plain film chest xray which shows no evidence of acute intrathoracic abnormality, no pneumonia. I agree with the radiologist interpretation.   Encouraged ibuprofen , Tylenol , rest, plenty of fluids.  Discussed extensive return precautions.  Patient discharged in stable condition at this time.  Final diagnoses:  Flu    ED Discharge Orders     None          Emily Horne 01/06/25 1138    Patsey Lot, MD 01/06/25 1406  "

## 2025-01-06 NOTE — Discharge Instructions (Signed)
You have the flu this is a viral infection that will likely start to improve after 7-10 days, antibiotics are not helpful in treating viral infections. Since your symptoms have been present for more than 2 days Tamiflu will give no additional benefit.  Please make sure you are drinking plenty of fluids. You can treat your symptoms supportively with tylenol 650 mg/1000mg and ibuprofen 600 mg every 6 hours for fevers and pains. For nasal congestion you can use Zyrtec and Flonase to help with nasal congestion. To treat cough you can use over the counter cough medications such as Mucinex DM or Robitussin and throat lozenges. If your symptoms are not improving please follow up with you Primary doctor.   If you develop persistent fevers, shortness of breath or difficulty breathing, chest pain, severe headache and neck pain, persistent nausea and vomiting or other new or concerning symptoms return to the Emergency department.
